# Patient Record
Sex: Female | Born: 1965 | Race: Black or African American | Hispanic: No | Smoking: Never smoker
Health system: Southern US, Community
[De-identification: ages and names within clinical notes are randomized; demographics above are authoritative.]

## PROBLEM LIST (undated history)

## (undated) DIAGNOSIS — J45909 Unspecified asthma, uncomplicated: Secondary | ICD-10-CM

## (undated) DIAGNOSIS — R0600 Dyspnea, unspecified: Secondary | ICD-10-CM

## (undated) DIAGNOSIS — R51 Headache: Secondary | ICD-10-CM

## (undated) DIAGNOSIS — J302 Other seasonal allergic rhinitis: Secondary | ICD-10-CM

## (undated) DIAGNOSIS — K219 Gastro-esophageal reflux disease without esophagitis: Secondary | ICD-10-CM

## (undated) DIAGNOSIS — G4730 Sleep apnea, unspecified: Secondary | ICD-10-CM

## (undated) DIAGNOSIS — R519 Headache, unspecified: Secondary | ICD-10-CM

## (undated) HISTORY — DX: Unspecified asthma, uncomplicated: J45.909

## (undated) HISTORY — DX: Headache: R51

## (undated) HISTORY — DX: Other seasonal allergic rhinitis: J30.2

## (undated) HISTORY — DX: Sleep apnea, unspecified: G47.30

## (undated) HISTORY — DX: Headache, unspecified: R51.9

---
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
## 1992-02-26 HISTORY — PX: TUBAL LIGATION: SHX77

## 2006-12-12 LAB — HM PAP SMEAR: HM Pap smear: NEGATIVE

## 2015-12-23 ENCOUNTER — Encounter: Payer: Self-pay | Admitting: Emergency Medicine

## 2015-12-23 ENCOUNTER — Emergency Department
Admission: EM | Admit: 2015-12-23 | Discharge: 2015-12-23 | Disposition: A | Discharge status: Home / Self Care | Payer: BC Managed Care – PPO | Attending: Emergency Medicine | Admitting: Emergency Medicine

## 2015-12-23 ENCOUNTER — Emergency Department: Payer: BC Managed Care – PPO

## 2015-12-23 DIAGNOSIS — K8050 Calculus of bile duct without cholangitis or cholecystitis without obstruction: Secondary | ICD-10-CM | POA: Diagnosis not present

## 2015-12-23 DIAGNOSIS — R1013 Epigastric pain: Secondary | ICD-10-CM | POA: Diagnosis present

## 2015-12-23 DIAGNOSIS — R079 Chest pain, unspecified: Secondary | ICD-10-CM

## 2015-12-23 LAB — HEPATIC FUNCTION PANEL
ALBUMIN: 2.9 g/dL — AB (ref 3.5–5.0)
ALT: 13 U/L — AB (ref 14–54)
AST: 17 U/L (ref 15–41)
Alkaline Phosphatase: 84 U/L (ref 38–126)
Bilirubin, Direct: <0.1 mg/dL — ABNORMAL LOW (ref 0.1–0.5)
TOTAL PROTEIN: 7.6 g/dL (ref 6.5–8.1)
Total Bilirubin: 0.1 mg/dL — ABNORMAL LOW (ref 0.3–1.2)

## 2015-12-23 LAB — CBC
HEMATOCRIT: 35.8 % (ref 35.0–47.0)
HEMOGLOBIN: 11.9 g/dL — AB (ref 12.0–16.0)
MCH: 28.5 pg (ref 26.0–34.0)
MCHC: 33.1 g/dL (ref 32.0–36.0)
MCV: 86.1 fL (ref 80.0–100.0)
Platelets: 305 10*3/uL (ref 150–440)
RBC: 4.16 MIL/uL (ref 3.80–5.20)
RDW: 13.3 % (ref 11.5–14.5)
WBC: 8.7 10*3/uL (ref 3.6–11.0)

## 2015-12-23 LAB — TROPONIN I: TROPONIN I: 0.02 ng/mL (ref <0.03)

## 2015-12-23 LAB — BASIC METABOLIC PANEL
ANION GAP: 7 (ref 5–15)
BUN: 14 mg/dL (ref 6–20)
CALCIUM: 9.3 mg/dL (ref 8.9–10.3)
CO2: 28 mmol/L (ref 22–32)
Chloride: 105 mmol/L (ref 101–111)
Creatinine, Ser: 1.23 mg/dL — ABNORMAL HIGH (ref 0.44–1.00)
GFR calc Af Amer: 57 mL/min — ABNORMAL LOW (ref >60)
GFR, EST NON AFRICAN AMERICAN: 50 mL/min — AB (ref >60)
GLUCOSE: 159 mg/dL — AB (ref 65–99)
POTASSIUM: 3.7 mmol/L (ref 3.5–5.1)
SODIUM: 140 mmol/L (ref 135–145)

## 2015-12-23 LAB — LIPASE, BLOOD: LIPASE: 16 U/L (ref 11–51)

## 2015-12-23 MED ORDER — ONDANSETRON HCL 4 MG/2ML IJ SOLN
4.0000 mg | Freq: Once | INTRAMUSCULAR | Status: AC
  Administered 2015-12-23: 4 mg via INTRAVENOUS
  Filled 2015-12-23: qty 2

## 2015-12-23 MED ORDER — GI COCKTAIL ~~LOC~~
30.0000 mL | Freq: Once | ORAL | Status: AC
  Administered 2015-12-23: 30 mL via ORAL
  Filled 2015-12-23: qty 30

## 2015-12-23 MED ORDER — MORPHINE SULFATE (PF) 2 MG/ML IV SOLN
4.0000 mg | Freq: Once | INTRAVENOUS | Status: AC
  Administered 2015-12-23: 4 mg via INTRAVENOUS
  Filled 2015-12-23: qty 2

## 2015-12-23 MED ORDER — PANTOPRAZOLE SODIUM 40 MG PO TBEC: 40.0000 mg | DELAYED_RELEASE_TABLET | Freq: Every day | ORAL | 1 refills | Status: DC

## 2015-12-23 MED ORDER — HYDROCODONE-ACETAMINOPHEN 5-325 MG PO TABS: 1.0000 | ORAL_TABLET | ORAL | 0 refills | Status: DC | PRN

## 2015-12-23 NOTE — Discharge Instructions (Signed)
Please call the number provided for general surgery to arrange a follow-up appointment as soon as possible. Please take your pain medication as needed, as prescribed and your Protonix every day. Return to the emergency department for any further abdominal pain, any chest pain, or any other symptom personally concerning to yourself.

## 2015-12-23 NOTE — ED Provider Notes (Signed)
[facility] Emergency Department Provider Note  Time seen: 3:28 PM  I have reviewed the triage vital signs and the nursing notes.   HISTORY  Chief Complaint Chest Pain    HPI Ashley Terry is a 50 y.o. female who presents to the emergency department for upper abdominal pain. According to the patient for the past 5 days she has been experiencing intermittent epigastric/chest pain. Describes the pain as a heaviness in the center of her chest with a burning feeling in the epigastrium. Patient also states nausea with occasional vomiting. States the discomfort was worse today so she came to the emergency department for evaluation. Denies any trouble breathing. Denies any diaphoresis. Denies diarrhea. Denies fever.  History reviewed. No pertinent past medical history.  There are no active problems to display for this patient.   History reviewed. No pertinent surgical history.  Prior to Admission medications   Not on File    Allergies no known allergies  History reviewed. No pertinent family history.  Social History Social History  Substance Use Topics  . Smoking status: Never Smoker  . Smokeless tobacco: Never Used  . Alcohol use No    Review of Systems Constitutional: Negative for fever. Cardiovascular: Positive for chest heaviness/burning  Respiratory: Negative for shortness of breath. Gastrointestinal: Upper abdominal pain positive for nausea or vomiting. Negative diarrhea. Genitourinary: Negative for dysuria. Musculoskeletal: Negative for back pain. Skin: Negative for rash. Neurological: Negative for headache 10-point ROS otherwise negative.  ____________________________________________   PHYSICAL EXAM:  VITAL SIGNS: ED Triage Vitals  Enc Vitals Group     BP 12/23/15 1140 (!) 222/83     Pulse Rate 12/23/15 1140 72     Resp 12/23/15 1140 18     Temp 12/23/15 1140 98.2 F (36.8 C)     Temp Source 12/23/15 1140 Oral     SpO2 12/23/15  1140 100 %     Weight 12/23/15 1141 250 lb (113.4 kg)     Height 12/23/15 1141 5' (1.524 m)     Head Circumference --      Peak Flow --      Pain Score 12/23/15 1141 8     Pain Loc --      Pain Edu? --      Excl. in GC? --     Constitutional: Alert and oriented. Well appearing and in no distress. Eyes: Normal exam ENT   Head: Normocephalic and atraumatic.   Mouth/Throat: Mucous membranes are moist. Cardiovascular: Normal rate, regular rhythm. No murmur Respiratory: Normal respiratory effort without tachypnea nor retractions. Breath sounds are clear Gastrointestinal: Soft, moderate epigastric and right upper quadrant tenderness palpation. No rebound or guarding. No distention. Obese. Musculoskeletal: Nontender with normal range of motion in all extremities.  Neurologic:  Normal speech and language. No gross focal neurologic deficits Skin:  Skin is warm, dry and intact.  Psychiatric: Mood and affect are normal.  ____________________________________________    EKG  EKG reviewed and interpreted, so shows normal sinus rhythm at 66 bpm, narrow QRS, normal axis, normal intervals, no concerning ST changes. Overall reassuring EKG.  ____________________________________________    RADIOLOGY  Chest x-ray negative  ____________________________________________   INITIAL IMPRESSION / ASSESSMENT AND PLAN / ED COURSE  Pertinent labs & imaging results that were available during my care of the patient were reviewed by me and considered in my medical decision making (see chart for details).  The patient presents to the emergency department with chest pain/burning in the epigastric burning. Patient  also states nausea and vomiting. Symptoms have been intermittent over the past 5 days but fairly constant today per patient. Patient has moderate epigastric and right upper quadrant tenderness palpation. Still has her gallbladder. We will check labs including hepatic function panel, lipase,  troponin. We'll obtain a right upper quadrant ultrasound to further evaluate. We'll treat the patient's pain and nausea, IV hydrate while awaiting results.  Patient remains quite hypertensive. We will control the patient's pain and monitor her blood pressure. She states she has never been on blood pressure medications in the past. Last saw her PCP 2 years ago. Sound consistent with cholelithiasis without signs of cholecystitis. I suspect the patient's discomfort is likely due to biliary colic. LFTs are largely within normal limits.  Patient states her pain is nearly gone, but continues to have mild epigastric and right upper quadrant discomfort. I highly suspect biliary colic because of the patient's discomfort. We will discharge with a course of pain medication, a PPI and general surgery follow-up for further evaluation. I discussed return precautions for any further abdominal pain, vomiting or fever, or any chest pain. I also discussed a low-fat diet. Patient agreeable to plan.  ____________________________________________   FINAL CLINICAL IMPRESSION(S) / ED DIAGNOSES  Epigastric pain Chest pain Emesis Biliary colic   Minna AntisKevin Javi Bollman, MD 12/23/15 1924

## 2015-12-23 NOTE — ED Triage Notes (Signed)
Pt to ed with c/o chest tightness intermittent x 4 days.  Pt denies sob, reports headache, nausea, and bilat arm weakness.

## 2015-12-23 NOTE — ED Notes (Signed)
Pt back from us. States no dx of htn but hasn't seen pcp in "years" - currently has no pcp.

## 2015-12-25 ENCOUNTER — Telehealth: Payer: Self-pay | Admitting: Surgery

## 2015-12-25 NOTE — Telephone Encounter (Signed)
Left voice message for patient to call and schedule appointment

## 2015-12-27 ENCOUNTER — Other Ambulatory Visit: Payer: Self-pay

## 2016-01-03 ENCOUNTER — Telehealth: Payer: Self-pay

## 2016-01-03 ENCOUNTER — Ambulatory Visit (INDEPENDENT_AMBULATORY_CARE_PROVIDER_SITE_OTHER): Payer: BC Managed Care – PPO | Admitting: Surgery

## 2016-01-03 ENCOUNTER — Encounter: Payer: Self-pay | Admitting: Surgery

## 2016-01-03 VITALS — BP 203/93 | HR 70 | Temp 97.9°F | Ht 60.0 in | Wt 298.5 lb

## 2016-01-03 DIAGNOSIS — R079 Chest pain, unspecified: Secondary | ICD-10-CM

## 2016-01-03 DIAGNOSIS — I10 Essential (primary) hypertension: Secondary | ICD-10-CM | POA: Diagnosis not present

## 2016-01-03 DIAGNOSIS — K8020 Calculus of gallbladder without cholecystitis without obstruction: Secondary | ICD-10-CM | POA: Diagnosis not present

## 2016-01-03 MED ORDER — FAMOTIDINE 20 MG PO TABS: 20.0000 mg | ORAL_TABLET | Freq: Two times a day (BID) | ORAL | 1 refills | Status: DC

## 2016-01-03 NOTE — Progress Notes (Signed)
Ashley Terry is an 50 y.o. female.   Chief Complaint: Gallstones HPI: This a patient who is in the emergency room recently with upper abdominal pain and was ruled out for chest source. A workup continued to show signs of upper abdominal pain and tenderness with cholelithiasis on ultrasound without signs of cholecystitis and normal liver function tests. Patient describes 1 month of upper abdominal and chest pain. Today she has no abdominal pain but points to her upper chest near the upper sternum as a source of her pain. Yesterday she was short of breath as well. She's had some nausea but no vomiting in the last 10 days she has vomited previous today. Denies fevers or chills no jaundice or acholic stools  Family history is noncontributory Patient works at [employer] does not smoke or drink    History reviewed. No pertinent past medical history.  Past Surgical History:  Procedure Laterality Date  . CESAREAN SECTION  1980  . CESAREAN SECTION  1992  . TONSILECTOMY, ADENOIDECTOMY, BILATERAL MYRINGOTOMY AND TUBES  1980  . TUBAL LIGATION  1994    Family History  Problem Relation Age of Onset  . Diabetes Mother   . Hypertension Mother   . Stroke Father   . Pancreatic cancer Maternal Grandmother   . Breast cancer Paternal Grandmother    Social History:  reports that she has never smoked. She has never used smokeless tobacco. She reports that she does not drink alcohol or use drugs.  Allergies: No Known Allergies   (Not in a hospital admission)   Review of Systems:   Review of Systems  Constitutional: Negative for chills and fever.  HENT: Negative.   Eyes: Negative.   Respiratory: Positive for shortness of breath. Negative for wheezing.   Cardiovascular: Positive for chest pain. Negative for palpitations.  Gastrointestinal: Positive for abdominal pain and nausea. Negative for blood in stool, constipation, diarrhea and vomiting.  Genitourinary: Negative.   Musculoskeletal: Negative.    Skin: Negative.   Neurological: Negative.   Endo/Heme/Allergies: Negative.   Psychiatric/Behavioral: Negative.     Physical Exam:  Physical Exam  Constitutional: She is oriented to person, place, and time. No distress.  Morbidly obese Blood pressure 203/93  HENT:  Head: Normocephalic and atraumatic.  Eyes: Pupils are equal, round, and reactive to light. Right eye exhibits no discharge. Left eye exhibits no discharge. No scleral icterus.  Neck: Normal range of motion.  Cardiovascular: Normal rate, regular rhythm and normal heart sounds.   Pulmonary/Chest: Effort normal and breath sounds normal. No respiratory distress. She has no wheezes. She has no rales.  Abdominal: Soft. She exhibits no distension. There is tenderness. There is no rebound and no guarding.  Minimal tenderness across the upper abdomen and epigastrium without a Murphy sign  Musculoskeletal: Normal range of motion. She exhibits edema. She exhibits no tenderness.  Lymphadenopathy:    She has no cervical adenopathy.  Neurological: She is alert and oriented to person, place, and time.  Skin: Skin is warm and dry. No rash noted. She is not diaphoretic. No erythema.  Psychiatric: Mood and affect normal.  Vitals reviewed.   There were no vitals taken for this visit.    No results found for this or any previous visit (from the past 48 hour(s)). No results found.   Assessment/Plan This a patient with chest pain his chief complaint. Currently she has no abdominal pain and states that she was given a prescription for protonic's but stopped taking it because it made her  feel bad. It worsened her chest pain. She has not seen a primary care physician in several years. And takes no other medications.  With the upper chest pain and high blood pressure I would like her to see a primary care physician first I cannot schedule her for any sort of surgery with a systolic blood pressure over 200 and have that be a safe event.  Because of her problems with protonic's I will try an H2 blocker instead and see if that makes any difference in her upper chest symptoms that could be related to reflux or hyperacidity syndrome.  I will see her back shortly and decide about surgical options.  Ashley Hawichard E Elis Sauber, MD, FACS

## 2016-01-03 NOTE — Telephone Encounter (Signed)
LVM at this time for patient to call office regarding appointment with PCP.  Patient has an appointment with Ashley Terry 01/05/16 @ 9:00 am for elevated blood pressure.  Patient notified of follow up appointment with Dr.Cooper on 01/24/16 @ 9:00 am.

## 2016-01-03 NOTE — Patient Instructions (Addendum)
We would like for you to see a Primary Care physician. I will call you later today with an appointment. We would like for you to follow up with Dr.Cooper after you see the PCP. We have sent your medicine to your pharmacy.  Please call our office if you have questions or concerns.

## 2016-01-05 ENCOUNTER — Ambulatory Visit: Payer: BC Managed Care – PPO | Admitting: Family Medicine

## 2016-01-05 ENCOUNTER — Encounter: Payer: Self-pay | Admitting: Family Medicine

## 2016-01-05 ENCOUNTER — Ambulatory Visit (INDEPENDENT_AMBULATORY_CARE_PROVIDER_SITE_OTHER): Payer: BC Managed Care – PPO | Admitting: Family Medicine

## 2016-01-05 VITALS — BP 172/96 | HR 73 | Temp 97.8°F | Resp 16 | Ht 60.0 in | Wt 296.0 lb

## 2016-01-05 DIAGNOSIS — Z7689 Persons encountering health services in other specified circumstances: Secondary | ICD-10-CM

## 2016-01-05 DIAGNOSIS — G8929 Other chronic pain: Secondary | ICD-10-CM | POA: Insufficient documentation

## 2016-01-05 DIAGNOSIS — I10 Essential (primary) hypertension: Secondary | ICD-10-CM | POA: Diagnosis not present

## 2016-01-05 DIAGNOSIS — Z23 Encounter for immunization: Secondary | ICD-10-CM | POA: Diagnosis not present

## 2016-01-05 DIAGNOSIS — R519 Headache, unspecified: Secondary | ICD-10-CM | POA: Insufficient documentation

## 2016-01-05 DIAGNOSIS — Z6841 Body Mass Index (BMI) 40.0 and over, adult: Secondary | ICD-10-CM | POA: Diagnosis not present

## 2016-01-05 DIAGNOSIS — G4733 Obstructive sleep apnea (adult) (pediatric): Secondary | ICD-10-CM | POA: Insufficient documentation

## 2016-01-05 DIAGNOSIS — E118 Type 2 diabetes mellitus with unspecified complications: Secondary | ICD-10-CM | POA: Diagnosis not present

## 2016-01-05 DIAGNOSIS — K219 Gastro-esophageal reflux disease without esophagitis: Secondary | ICD-10-CM

## 2016-01-05 DIAGNOSIS — G44229 Chronic tension-type headache, not intractable: Secondary | ICD-10-CM

## 2016-01-05 DIAGNOSIS — R110 Nausea: Secondary | ICD-10-CM | POA: Diagnosis not present

## 2016-01-05 DIAGNOSIS — R7989 Other specified abnormal findings of blood chemistry: Secondary | ICD-10-CM | POA: Diagnosis not present

## 2016-01-05 DIAGNOSIS — R51 Headache: Secondary | ICD-10-CM

## 2016-01-05 DIAGNOSIS — K8020 Calculus of gallbladder without cholecystitis without obstruction: Secondary | ICD-10-CM | POA: Insufficient documentation

## 2016-01-05 DIAGNOSIS — G4730 Sleep apnea, unspecified: Secondary | ICD-10-CM | POA: Insufficient documentation

## 2016-01-05 DIAGNOSIS — E1140 Type 2 diabetes mellitus with diabetic neuropathy, unspecified: Secondary | ICD-10-CM | POA: Insufficient documentation

## 2016-01-05 DIAGNOSIS — I129 Hypertensive chronic kidney disease with stage 1 through stage 4 chronic kidney disease, or unspecified chronic kidney disease: Secondary | ICD-10-CM | POA: Insufficient documentation

## 2016-01-05 MED ORDER — SUCRALFATE 1 G PO TABS: 1.0000 g | ORAL_TABLET | Freq: Three times a day (TID) | ORAL | 0 refills | Status: DC

## 2016-01-05 MED ORDER — HYDROCHLOROTHIAZIDE 25 MG PO TABS: 25.0000 mg | ORAL_TABLET | Freq: Every day | ORAL | 5 refills | Status: DC

## 2016-01-05 MED ORDER — ONDANSETRON 4 MG PO TBDP: 4.0000 mg | ORAL_TABLET | Freq: Three times a day (TID) | ORAL | 0 refills | Status: DC | PRN

## 2016-01-05 MED ORDER — LISINOPRIL 20 MG PO TABS: 20.0000 mg | ORAL_TABLET | Freq: Every day | ORAL | 5 refills | Status: DC

## 2016-01-05 NOTE — Progress Notes (Signed)
Subjective:    Patient ID: Ashley Terry, female    DOB: 1965-10-07, 50 y.o.   MRN: [id number]  Ashley Buffammy Poteet is a 50 y.o. female presenting on 01/05/2016 for Establish Care  [facility], last visit >5 years ago.  HPI   Hospital Follow-up / Recent Chest Pain Burning / CHOLELITHIASIS / GERD - Reported onset few weeks ago, she initially presented to Urgent Care then advised to go to ED for chest pain evaluation, with abdominal pain had work-up including RUQ Abdominal US showing cholelithiasis without cholecystitis - Saw Surgeons in office, thought symptoms due to gallstones, interested in elective surgery, has follow-up 11/29 - Still has symptoms of RUQ abdominal pain and heartburn with suspected GERD, was not taking protonix regular, tried at night did not tolerate - Has apt in 3 weeks with Surgery and they would discuss proceeding with elective choley if BP controlled  CHRONIC HTN: Reports does not check BP at home. Has never had prior diagnosis, significant elevated BP at general surgery office, referred to PCP now  Current Meds - None   - Drinks one cup coffee daily, no sodas, no tea, drinks water. 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 Still eating regular sweets donuts and chocolates occasionally. Follows DM low carb diet. - Exercise: previously regular walking and strength training, now she is no longer using weights. No longer walking, given recent health scare she is interested in goal of walking 3 days a week.  CHRONIC DM, Type 2: Reports previously her diabetes was well controlled without any medications and never insulin. Does not  know last A1c has been years. Does not check CBG - Was on ASA 81 previously, interested to restart Never on ACEi / ARB previously Denies hypoglycemia, polyuria, visual changes, numbness or tingling.  Chronic Headaches - Reports prior history of migraines only occasional, recently worsening over past 2 years with work and stress, uses computer constantly, headache worse with computer screen use. - She admits to having headaches for several days at a time. Most days, currently slight frontal bilateral temple headache - Admits nausea, occasional vomiting (NB NB) more likely from gallbladder - Takes occasional Ibuprofen 2 pills or Tylenol, doesn't repeat, moderate relief  OSA, on CPAP - Reports chronic problem for many years, first sleep study and CPAP machine 2000, wearing regularly, tolerating well. Does not have concerns. - Denies excessive sleepiness  Health Maintenance: - Due for Flu, received today - Defer other HM topics for next follow-up, due to other acute issues  Past Medical History:  Diagnosis Date  . Asthma   . Diabetes (HCC)   . Frequent headaches   . High blood pressure   . Seasonal allergies   . Sleep apnea    Social History   Social History  . Marital status: Widowed    Spouse name: N/A  . Number of children: N/A  . Years of education: N/A   Occupational History  . 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 Smoking status: Never Smoker  . Smokeless tobacco: Never Used  . Alcohol use No  . Drug use: No  . Sexual activity:  No   Other Topics Concern  . Not on file   Social History Narrative  . No narrative on file   Family History  Problem Relation Age of Onset  . Diabetes Mother   . Hypertension Mother   . Stroke Father   . Cancer Father     Amyloid  . Pancreatic cancer Maternal Grandmother   . Breast cancer Paternal Grandmother   . Prostate cancer Maternal Grandfather   . Cancer Paternal Grandfather     unknown     Current Outpatient Prescriptions on File Prior to Visit  Medication Sig  . famotidine (PEPCID) 20 MG tablet Take 1 tablet (20 mg total) by mouth 2 (two) times daily.   No current facility-administered medications on file prior to visit.     Review of Systems  Constitutional: Negative for activity change, appetite change, chills, diaphoresis, fatigue, fever and unexpected weight change.  HENT: Negative for congestion, hearing loss, sinus pain and sinus pressure.   Eyes: Negative for visual disturbance.  Respiratory: Negative for cough, choking, chest tightness, shortness of breath and wheezing.   Cardiovascular: Negative for chest pain, palpitations and leg swelling.  Gastrointestinal: Positive for abdominal distention, abdominal pain and nausea. Negative for anal bleeding, blood in stool, constipation, diarrhea and vomiting.  Endocrine: Negative for cold intolerance and polyuria.  Genitourinary: Negative for dysuria, frequency and hematuria.  Musculoskeletal: Negative for arthralgias, back pain and neck pain.  Skin: Negative for rash.  Allergic/Immunologic: Negative for environmental allergies.  Neurological: Positive for headaches. Negative for dizziness, weakness, light-headedness and numbness.  Hematological: Negative for adenopathy.  Psychiatric/Behavioral: Negative for behavioral problems, dysphoric mood and sleep disturbance. The patient is not nervous/anxious.    Per HPI unless specifically indicated above     Objective:    BP (!) 172/96 (BP Location: Left Arm, Cuff Size: Large)   Pulse 73   Temp 97.8 F (36.6 C) (Oral)   Resp 16   Ht 5' (1.524 m)   Wt 296 lb (134.3 kg)   BMI 57.81 kg/m   Wt Readings from Last 3 Encounters:  01/05/16 296 lb (134.3 kg)  01/03/16 298 lb 8 oz (135.4 kg)  12/23/15 250 lb (113.4 kg)    Physical Exam  Constitutional: She is oriented to person, place, and time. She appears well-developed and well-nourished. No distress.   Well-appearing, comfortable, cooperative, obese  HENT:  Head: Normocephalic and atraumatic.  Mouth/Throat: Oropharynx is clear and moist.  Frontal / maxillary sinuses non-tender. Nares patent without purulence or edema. Oropharynx clear without erythema, exudates, edema or asymmetry.  Eyes: Conjunctivae and EOM are normal. Pupils are equal, round, and reactive to light.  Neck: Normal range of motion. Neck supple. No thyromegaly present.  Cardiovascular: Normal rate, regular rhythm, normal heart sounds and intact distal pulses.   No murmur heard. Pulmonary/Chest: Effort normal and breath sounds normal. No respiratory distress. She has no wheezes. She has no rales.  Abdominal: Soft. Bowel sounds are normal. She exhibits no distension and no mass. There is no tenderness.  Musculoskeletal: Normal range of motion. She exhibits edema (bilateral lower extremity +1 pitting edema ankles and lower leg with trace). She exhibits no tenderness.  Back normal without deformity or abnormal curvature, limited exam by body habitus.  Upper / Lower Extremities: - Normal muscle tone, strength bilateral upper extremities 5/5, lower extremities 5/5  - Bilateral shoulders, knees without deformity, tenderness, effusion  - Normal Gait  Lymphadenopathy:    She has no cervical adenopathy.  Neurological:  She is alert and oriented to person, place, and time.  Skin: Skin is warm and dry. No rash noted. She is not diaphoretic.  Psychiatric: She has a normal mood and affect. Her behavior is normal.  Nursing note and vitals reviewed.  I have personally reviewed the radiology report from [facility] [place] on 12/23/15.  EXAM: US ABDOMEN LIMITED - RIGHT UPPER QUADRANT COMPARISON:  None. FINDINGS: Gallbladder: Cholelithiasis is noted without gallbladder wall thickening or pericholecystic fluid. The largest measures 9 mm. No sonographic Murphy's sign is noted. Common bile duct: Diameter: 4 mm which is within normal  limits. Liver: No focal lesion identified. Within normal limits in parenchymal echogenicity. IMPRESSION: Cholelithiasis without evidence cholecystitis. No other abnormality seen in the right upper quadrant of the abdomen.  I have personally reviewed the following lab results from 12/23/15.  Results for orders placed or performed during the hospital encounter of 12/23/15  Basic metabolic panel  Result Value Ref Range   Sodium 140 135 - 145 mmol/L   Potassium 3.7 3.5 - 5.1 mmol/L   Chloride 105 101 - 111 mmol/L   CO2 28 22 - 32 mmol/L   Glucose, Bld 159 (H) 65 - 99 mg/dL   BUN 14 6 - 20 mg/dL   Creatinine, Ser 4.09 (H) 0.44 - 1.00 mg/dL   Calcium 9.3 8.9 - 81.1 mg/dL   GFR calc non Af Amer 50 (L) >60 mL/min   GFR calc Af Amer 57 (L) >60 mL/min   Anion gap 7 5 - 15  CBC  Result Value Ref Range   WBC 8.7 3.6 - 11.0 K/uL   RBC 4.16 3.80 - 5.20 MIL/uL   Hemoglobin 11.9 (L) 12.0 - 16.0 g/dL   HCT 91.4 78.2 - 95.6 %   MCV 86.1 80.0 - 100.0 fL   MCH 28.5 26.0 - 34.0 pg   MCHC 33.1 32.0 - 36.0 g/dL   RDW 21.3 08.6 - 57.8 %   Platelets 305 150 - 440 K/uL  Troponin I  Result Value Ref Range   Troponin I 0.02 <0.03 ng/mL  Hepatic function panel  Result Value Ref Range   Total Protein 7.6 6.5 - 8.1 g/dL   Albumin 2.9 (L) 3.5 - 5.0 g/dL   AST 17 15 - 41 U/L   ALT 13 (L) 14 - 54 U/L   Alkaline Phosphatase 84 38 - 126 U/L   Total Bilirubin 0.1 (L) 0.3 - 1.2 mg/dL   Bilirubin, Direct <4.6 (L) 0.1 - 0.5 mg/dL   Indirect Bilirubin NOT CALCULATED 0.3 - 0.9 mg/dL  Lipase, blood  Result Value Ref Range   Lipase 16 11 - 51 U/L      Assessment & Plan:   Problem List Items Addressed This Visit    Sleep apnea    Continue CPAP, seems to be controlled, unlikely affecting BP but possible.      Morbid obesity with BMI of 50.0-59.9, adult (HCC)    Weight gain, no longer adhering to lifestyle improvements over past few years, inc stress - Check fasting labs - Follow-up, encouraged  lifestyle mods, with goal exercise walking 3x weekly for now      Relevant Orders   Lipid panel   TSH   Gastroesophageal reflux disease without esophagitis    Poorly controlled. Emphasized dietary modifications - Switch Protonix to morning 30 min before eating dosing - Add Carafate TID wc + bedtime for symptomatic relief - Follow-up as needed      Relevant Medications  pantoprazole (PROTONIX) 40 MG tablet   sucralfate (CARAFATE) 1 g tablet   ondansetron (ZOFRAN ODT) 4 MG disintegrating tablet   Essential hypertension    Uncontrolled HTN, likely for many years, previously >180-200 / 100. Initial check in office 170/90, similar on manual re-check. - No prior dx HTN. No prior meds  Plan: 1. Start Lisinopril 20mg  daily, HCTZ 25mg  daily 2. Check future BMET 10 days after new ACE start 3. Monitor BP outside office if can 4. Start ASA 81mg  daily 5. Lifestyle modifications start exercise and wt loss 6. Follow-up 2 weeks BP re-check, prior to upcoming Gen Surg f/u to discuss elective choley if BP controlled      Relevant Medications   hydrochlorothiazide (HYDRODIURIL) 25 MG tablet   lisinopril (PRINIVIL,ZESTRIL) 20 MG tablet   Other Relevant Orders   BASIC METABOLIC PANEL WITH GFR   TSH   Elevated serum creatinine    Last Cr 1.23 in ED, suspected mild dehydration. Not sure baseline Cr. Concern possible CKD with uncontrolled HTN and DM. - Re-check Cr 10 days (after new start ACE, HCTZ)      Diabetes (HCC)    Unknown level of control. Previously diagnosed >5 years ago diet controlled only, has had dramatic wt change over that time. Recent non fasting glucose 159 in ED - Elevated Cr to 1.23, possibly dehydration acutely in ED vs some complication with CKD  Plan: 1. Start ACE, check BMET 2. Check lipids, A1c for baseline and determine management 3. Lifestyle modifications 4. Restart ASA 81 daily 5. Follow-up 2 weeks review results, may be indicated for Metformin, statin or other  DM treatments pending A1c      Relevant Medications   lisinopril (PRINIVIL,ZESTRIL) 20 MG tablet   Other Relevant Orders   BASIC METABOLIC PANEL WITH GFR   Hemoglobin A1c   Lipid panel   Chronic headache    Likely secondary to tension headaches from stress, screen use and likely with uncontrolled HTN for years - Control BP, address cholelithiasis, control DM - Improve lifestyle - Re-address headaches if persistent      Relevant Medications   HYDROcodone-acetaminophen (NORCO/VICODIN) 5-325 MG tablet   Cholelithiasis    Persistently symptomatic, without evidence of cholecystitis. Close follow-up with general surgery pursue discussion of elective choley if we can improve BP control       Other Visit Diagnoses    Encounter to establish care with new doctor    -  Primary   Need for vaccination       Relevant Orders   Flu Vaccine QUAD 36+ mos PF IM (Fluarix & Fluzone Quad PF) (Completed)   Nausea without vomiting       Relevant Medications   ondansetron (ZOFRAN ODT) 4 MG disintegrating tablet      Meds ordered this encounter  Medications  .           .           . hydrochlorothiazide (HYDRODIURIL) 25 MG tablet    Sig: Take 1 tablet (25 mg total) by mouth daily.    Dispense:  30 tablet    Refill:  5  . lisinopril (PRINIVIL,ZESTRIL) 20 MG tablet    Sig: Take 1 tablet (20 mg total) by mouth daily.    Dispense:  30 tablet    Refill:  5  . DISCONTD: sucralfate (CARAFATE) 1 g tablet    Sig: Take 1 tablet (1 g total) by mouth 4 (four) times daily -  with meals and  at bedtime.    Dispense:  60 tablet    Refill:  0  . sucralfate (CARAFATE) 1 g tablet    Sig: Take 1 tablet (1 g total) by mouth 4 (four) times daily -  with meals and at bedtime.    Dispense:  60 tablet    Refill:  0  . ondansetron (ZOFRAN ODT) 4 MG disintegrating tablet    Sig: Take 1 tablet (4 mg total) by mouth every 8 (eight) hours as needed for nausea or vomiting.    Dispense:  30 tablet    Refill:  0       Follow up plan: Return in about 2 weeks (around 01/19/2016) for blood pressure, diabetes.  Saralyn Pilar, DO [facility] 01/06/2016, 11:42 AM

## 2016-01-05 NOTE — Patient Instructions (Signed)
Thank you for coming in to clinic today.  1. Start 2 new BP pills every day same time 2. Start regular exercise with walking as discussed 3. Try to check BP outside of office a few times, write down readings  You will be due for FASTING BLOOD WORK (no food or drink after midnight before, only water or coffee without cream/sugar on the morning of)  - Please go ahead and schedule a "Lab Only" visit in the morning at the clinic for lab draw in 10 days  For Lab Results, once available within 2-3 days of blood draw, you can can log in to MyChart online to view your results and a brief explanation. Also, we can discuss results at next follow-up visit.  Take baby ASA 81 daily  Recommend to start taking Tylenol Extra Strength 500mg  tabs - take 1 to 2 tabs (max 1000mg  per dose) every 6 hours for pain (take regularly, don't skip a dose for next 3-7 days), max 24 hour daily dose is 6 to 8 tablets or 3000 to 4000mg   Please schedule a follow-up appointment with Dr. Althea CharonKaramalegos in 2 weeks for BP, lab follow-up, diabetes  If you have any other questions or concerns, please feel free to call the clinic or send a message through MyChart. You may also schedule an earlier appointment if necessary.  [name] Karamalegos, DO [facility]

## 2016-01-05 NOTE — Telephone Encounter (Signed)
[place] [facility] at this time to check if patient showed for her appointment today for elevated blood pressure and was told yes by Liborio NixonJanice.

## 2016-01-06 ENCOUNTER — Encounter: Payer: Self-pay | Admitting: Family Medicine

## 2016-01-06 DIAGNOSIS — N183 Chronic kidney disease, stage 3 unspecified: Secondary | ICD-10-CM | POA: Insufficient documentation

## 2016-01-06 NOTE — Assessment & Plan Note (Signed)
Last Cr 1.23 in ED, suspected mild dehydration. Not sure baseline Cr. Concern possible CKD with uncontrolled HTN and DM. - Re-check Cr 10 days (after new start ACE, HCTZ)

## 2016-01-06 NOTE — Assessment & Plan Note (Signed)
Weight gain, no longer adhering to lifestyle improvements over past few years, inc stress - Check fasting labs - Follow-up, encouraged lifestyle mods, with goal exercise walking 3x weekly for now

## 2016-01-06 NOTE — Assessment & Plan Note (Addendum)
Unknown level of control. Previously diagnosed >5 years ago diet controlled only, has had dramatic wt change over that time. Recent non fasting glucose 159 in ED - Elevated Cr to 1.23, possibly dehydration acutely in ED vs some complication with CKD  Plan: 1. Start ACE, check BMET 2. Check lipids, A1c for baseline and determine management 3. Lifestyle modifications 4. Restart ASA 81 daily 5. Follow-up 2 weeks review results, may be indicated for Metformin, statin or other DM treatments pending A1c

## 2016-01-06 NOTE — Assessment & Plan Note (Signed)
Continue CPAP, seems to be controlled, unlikely affecting BP but possible.

## 2016-01-06 NOTE — Assessment & Plan Note (Signed)
Poorly controlled. Emphasized dietary modifications - Switch Protonix to morning 30 min before eating dosing - Add Carafate TID wc + bedtime for symptomatic relief - Follow-up as needed

## 2016-01-06 NOTE — Assessment & Plan Note (Signed)
Likely secondary to tension headaches from stress, screen use and likely with uncontrolled HTN for years - Control BP, address cholelithiasis, control DM - Improve lifestyle - Re-address headaches if persistent

## 2016-01-06 NOTE — Assessment & Plan Note (Signed)
Persistently symptomatic, without evidence of cholecystitis. Close follow-up with general surgery pursue discussion of elective choley if we can improve BP control

## 2016-01-06 NOTE — Assessment & Plan Note (Signed)
Uncontrolled HTN, likely for many years, previously >180-200 / 100. Initial check in office 170/90, similar on manual re-check. - No prior dx HTN. No prior meds  Plan: 1. Start Lisinopril 20mg  daily, HCTZ 25mg  daily 2. Check future BMET 10 days after new ACE start 3. Monitor BP outside office if can 4. Start ASA 81mg  daily 5. Lifestyle modifications start exercise and wt loss 6. Follow-up 2 weeks BP re-check, prior to upcoming Gen Surg f/u to discuss elective choley if BP controlled

## 2016-01-15 ENCOUNTER — Telehealth: Payer: Self-pay

## 2016-01-15 NOTE — Telephone Encounter (Signed)
Called patient because I saw that she was coming in to see Dr. Excell Seltzerooper on 01/24/2016. However, she needs her blood pressure to be under control before she comes back and discuss surgery with Dr. Excell Seltzerooper. According to her primary care doctor, she was to go back in two weeks from 01/05/2016 and saw thaw that she did not schedule a follow up appointment with him. I left patient a detailed message letting her know that she needed to see her primary care doctor prior to seeing Dr. Excell Seltzerooper. I advised for her to call her primary care doctor and schedule an appointment before 01/24/2016. If not, then we would have to reschedule her appointment until we get an okay (Medical Clearance) from her primary care doctor that she has her blood pressure under control.

## 2016-01-22 ENCOUNTER — Other Ambulatory Visit: Payer: Self-pay | Admitting: Family Medicine

## 2016-01-22 ENCOUNTER — Ambulatory Visit (INDEPENDENT_AMBULATORY_CARE_PROVIDER_SITE_OTHER): Payer: BC Managed Care – PPO | Admitting: Family Medicine

## 2016-01-22 ENCOUNTER — Encounter: Payer: Self-pay | Admitting: Family Medicine

## 2016-01-22 ENCOUNTER — Telehealth: Payer: Self-pay

## 2016-01-22 VITALS — BP 168/82 | HR 75 | Temp 98.2°F | Resp 16 | Ht 60.0 in | Wt 293.0 lb

## 2016-01-22 DIAGNOSIS — K8020 Calculus of gallbladder without cholecystitis without obstruction: Secondary | ICD-10-CM | POA: Diagnosis not present

## 2016-01-22 DIAGNOSIS — Z6841 Body Mass Index (BMI) 40.0 and over, adult: Secondary | ICD-10-CM | POA: Diagnosis not present

## 2016-01-22 DIAGNOSIS — K219 Gastro-esophageal reflux disease without esophagitis: Secondary | ICD-10-CM | POA: Diagnosis not present

## 2016-01-22 DIAGNOSIS — I10 Essential (primary) hypertension: Secondary | ICD-10-CM

## 2016-01-22 DIAGNOSIS — G44229 Chronic tension-type headache, not intractable: Secondary | ICD-10-CM | POA: Diagnosis not present

## 2016-01-22 DIAGNOSIS — R7989 Other specified abnormal findings of blood chemistry: Secondary | ICD-10-CM | POA: Diagnosis not present

## 2016-01-22 DIAGNOSIS — R0789 Other chest pain: Secondary | ICD-10-CM | POA: Diagnosis not present

## 2016-01-22 LAB — BASIC METABOLIC PANEL WITH GFR
BUN: 20 mg/dL (ref 7–25)
CO2: 29 mmol/L (ref 20–31)
Calcium: 9.7 mg/dL (ref 8.6–10.4)
Chloride: 103 mmol/L (ref 98–110)
Creat: 1.41 mg/dL — ABNORMAL HIGH (ref 0.50–1.05)
GFR, EST AFRICAN AMERICAN: 49 mL/min — AB (ref >=60)
GFR, EST NON AFRICAN AMERICAN: 43 mL/min — AB (ref >=60)
Glucose, Bld: 149 mg/dL — ABNORMAL HIGH (ref 65–99)
POTASSIUM: 4.2 mmol/L (ref 3.5–5.3)
Sodium: 139 mmol/L (ref 135–146)

## 2016-01-22 LAB — HEMOGLOBIN A1C
Hgb A1c MFr Bld: 8.4 % — ABNORMAL HIGH (ref <5.7)
Mean Plasma Glucose: 194 mg/dL

## 2016-01-22 LAB — LIPID PANEL
CHOL/HDL RATIO: 7.3 ratio — AB (ref <5.0)
CHOLESTEROL: 270 mg/dL — AB (ref <200)
HDL: 37 mg/dL — AB (ref >50)
LDL Cholesterol: 181 mg/dL — ABNORMAL HIGH (ref <100)
TRIGLYCERIDES: 260 mg/dL — AB (ref <150)
VLDL: 52 mg/dL — ABNORMAL HIGH (ref <30)

## 2016-01-22 MED ORDER — AMLODIPINE BESYLATE 10 MG PO TABS: 10.0000 mg | ORAL_TABLET | Freq: Every day | ORAL | 5 refills | Status: DC

## 2016-01-22 MED ORDER — PANTOPRAZOLE SODIUM 40 MG PO TBEC: 40.0000 mg | DELAYED_RELEASE_TABLET | Freq: Every day | ORAL | 2 refills | Status: DC

## 2016-01-22 NOTE — Assessment & Plan Note (Signed)
Likely tension headaches, still HTN uncontrolled - Continue to address underlying factors, no changes to headache treatment today

## 2016-01-22 NOTE — Assessment & Plan Note (Signed)
Now mild improved with some wt loss on improved lifestyle some regular walking now at goal 3x weekly - Check fasting labs - Follow-up, encouraged lifestyle mods

## 2016-01-22 NOTE — Assessment & Plan Note (Addendum)
Persistent symptoms without worsening or significant improvement over 2 weeks. No evidence of cholecystitis. - Concern that current symptoms do not seem all entirely related to cholecystitis, concern with uncontrolled HTN among other issues that increase her risk for possible cardiovascular etiology - [facility] checked notice that I am not clearing her for cardiac surgery, referral to Cardiology placed, to be faxed back

## 2016-01-22 NOTE — Progress Notes (Signed)
Subjective:    Patient ID: Ashley Terry, female    DOB: [birth year], 50 y.o.   MRN: [id number]  Ashley Terry is a 50 y.o. female presenting on 01/22/2016 for Hypertension (pt is seeing Dr. Excell Seltzerooper for gallbladder surgery on 11/29 need medical clearance for B/P)  HPI   Chest Pressure / CHOLELITHIASIS / GERD - Follow-up from last visit with me to establish care on 11/10 after dx uncontrolled HTN pending future elective cholecystectomy. See last note for background history. - Today reports essentially unchanged constant chest substernal chest heaviness, with sharp pain to back on deep breaths, describes as a chest congestion or tightness with some burning, and pain is reproducible to touch. Worse with laying down with increased burning. Some worsening with eating, but she tolerates soups well. - Taking carafate QID without any significant relief - Taking Protonix 40mg  daily, requests refill today - Still nausea, takes Zofran ODT regularly with some minor relief - She is scheduled to see Dr Excell Seltzerooper (General Surgery) in follow-up on 11/29 for pre-op evaluation for elective choley given recent dx cholelithiasis on Abdominal US in ED recently, however they are waiting on cardiac / medical clearance prior to proceeding. Patient has not seen Cardiologist before, has not had prior stress test or ECHO. Last EKG done in ED on 12/23/15. She can tolerate increased activity with walking exercise, but still having same symptoms does not seem to be related to physical exertion  CHRONIC HTN: - Last visit 11/10 with uncontrolled HTN, suspected for several years, see note for background info - Today she reports taking the Lisinopril 20 and HCTZ 25mg  daily as prescribed >2 weeks ago, home BP checks have slowly improved but still elevated, initially SBP 180-200, now avg 160s-170 with DBP 70-80s - Never taken any other anti-HTN meds before - Limited caffeine, never smoker, no alcohol. 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[employer])  MORBID OBESITY BMI >57 - Weight previously 350 lbs down to 290 lbs over past few years, attributes to stress at work and no longer exercising or keeping up with her health - Today wt still down a few lbs, 293 from 296+ previously, trying to adhere to healthy diet and now starting to do more walking up to 3 days a week as set for goal last time  Chronic Headaches - Still regular headaches, onset in morning and will wake up with headache at night, bilateral frontal tension headache, takes Tylenol Ext Str 1000mg  per dose TID and occasional excedrin migraine with relief (has only taken x 2 doses). Prior history of migraine headaches worse with computer screen use. - She admits to having headaches for several days at a time. Most days, currently slight frontal bilateral temple headache - Admits nausea without vomiting, see above  Review of Systems  Constitutional: Negative for activity change, appetite change, chills, diaphoresis, fatigue, fever and unexpected weight change.  HENT: Negative for congestion, hearing loss, sinus pain and sinus pressure.   Eyes: Negative for visual disturbance.  Respiratory: Negative for cough, choking, chest tightness, shortness of breath and wheezing.   Cardiovascular: Negative for chest pain, palpitations and leg swelling.  Gastrointestinal: Positive for abdominal distention, abdominal pain and nausea. Negative for anal bleeding, blood in stool, constipation, diarrhea and vomiting.  Endocrine: Negative for cold intolerance and polyuria.  Genitourinary: Negative for dysuria, frequency and hematuria.  Musculoskeletal: Negative for arthralgias, back pain and neck pain.  Skin: Negative for rash.  Allergic/Immunologic: Negative for environmental  allergies.  Neurological: Positive for headaches. Negative for dizziness, weakness, light-headedness and numbness.  Hematological: Negative for adenopathy.    Psychiatric/Behavioral: Negative for behavioral problems, dysphoric mood and sleep disturbance. The patient is not nervous/anxious.    Per HPI unless specifically indicated above     Objective:    BP (!) 168/82 (BP Location: Left Arm, Cuff Size: Normal)   Pulse 75   Temp 98.2 F (36.8 C) (Oral)   Resp 16   Ht 5' (1.524 m)   Wt 293 lb (132.9 kg)   BMI 57.22 kg/m   Wt Readings from Last 3 Encounters:  01/22/16 293 lb (132.9 kg)  01/05/16 296 lb (134.3 kg)  01/03/16 298 lb 8 oz (135.4 kg)    Physical Exam  Constitutional: She is oriented to person, place, and time. She appears well-developed and well-nourished. No distress.  Well-appearing, comfortable, cooperative, obese  HENT:  Head: Normocephalic and atraumatic.  Mouth/Throat: Oropharynx is clear and moist.  Cardiovascular: Normal rate, regular rhythm, normal heart sounds and intact distal pulses.   No murmur heard. Pulmonary/Chest: Effort normal and breath sounds normal. No respiratory distress. She has no wheezes. She has no rales. She exhibits tenderness (reproducible mid chest and sternal / epigastric chest tenderness).  Abdominal: Soft. Bowel sounds are normal. She exhibits no distension and no mass. There is no tenderness.  Musculoskeletal: Normal range of motion. She exhibits edema (stable, unchanged bilateral lower extremity +1 pitting edema ankles and lower leg with trace). She exhibits no tenderness.  Neurological: She is alert and oriented to person, place, and time.  Skin: Skin is warm and dry. No rash noted. She is not diaphoretic.  Psychiatric: Her behavior is normal.  Nursing note and vitals reviewed.   Results for orders placed or performed during the hospital encounter of 12/23/15  Basic metabolic panel  Result Value Ref Range   Sodium 140 135 - 145 mmol/L   Potassium 3.7 3.5 - 5.1 mmol/L   Chloride 105 101 - 111 mmol/L   CO2 28 22 - 32 mmol/L   Glucose, Bld 159 (H) 65 - 99 mg/dL   BUN 14 6 - 20 mg/dL    Creatinine, Ser 1.611.23 (H) 0.44 - 1.00 mg/dL   Calcium 9.3 8.9 - 09.610.3 mg/dL   GFR calc non Af Amer 50 (L) >60 mL/min   GFR calc Af Amer 57 (L) >60 mL/min   Anion gap 7 5 - 15  CBC  Result Value Ref Range   WBC 8.7 3.6 - 11.0 K/uL   RBC 4.16 3.80 - 5.20 MIL/uL   Hemoglobin 11.9 (L) 12.0 - 16.0 g/dL   HCT 04.535.8 40.935.0 - 81.147.0 %   MCV 86.1 80.0 - 100.0 fL   MCH 28.5 26.0 - 34.0 pg   MCHC 33.1 32.0 - 36.0 g/dL   RDW 91.413.3 78.211.5 - 95.614.5 %   Platelets 305 150 - 440 K/uL  Troponin I  Result Value Ref Range   Troponin I 0.02 <0.03 ng/mL  Hepatic function panel  Result Value Ref Range   Total Protein 7.6 6.5 - 8.1 g/dL   Albumin 2.9 (L) 3.5 - 5.0 g/dL   AST 17 15 - 41 U/L   ALT 13 (L) 14 - 54 U/L   Alkaline Phosphatase 84 38 - 126 U/L   Total Bilirubin 0.1 (L) 0.3 - 1.2 mg/dL   Bilirubin, Direct <2.1<0.1 (L) 0.1 - 0.5 mg/dL   Indirect Bilirubin NOT CALCULATED 0.3 - 0.9 mg/dL  Lipase, blood  Result Value Ref Range   Lipase 16 11 - 51 U/L      Assessment & Plan:   Problem List Items Addressed This Visit    Morbid obesity with BMI of 50.0-59.9, adult (HCC)    Now mild improved with some wt loss on improved lifestyle some regular walking now at goal 3x weekly - Check fasting labs - Follow-up, encouraged lifestyle mods      Gastroesophageal reflux disease without esophagitis    Improved but persistent symptoms concern for cholelithiasis. - Refill Protonix 40mg  daily today - Discontinue Carafate not improving symptoms, unlikely to be PUD      Relevant Medications   pantoprazole (PROTONIX) 40 MG tablet   Essential hypertension - Primary    Mild improvement but still uncontrolled HTN, concern with chronic lack of anti-HTN therapy. Only mild response to initiating ACEi, Thiazide 2 weeks ago. SBP still >160-170 on avg, DBP is controlled now. Home BP checks consistent with manual office re-check.  Plan: 1. Start Amlodipine 10mg  daily, continue lisinopril 20mg  and HCTZ 25mg  daily, future can  consider titrate up Lisinopril to 40mg  daily if still need additional control 2. Check BMET today - ordered last visit but she did not follow-up for labs after new start ACEi, check Na, K, and Cr (last Cr mild elevated 1.23 but no comparison) 3. Continue monitor BP outside office bring log to next visit 4. Continue ASA 81mg  daily 5. Continue improved lifestyle modifications with walking and diet 6. I am not comfortable granting patient cardiac clearance for surgery, proceed with referral to [facility] Cardiology for further evaluation of persistent chest pressure and for cardiac clearance of upcoming tentative elective cholecystectomy, additionally Cardiology can assist with difficult to control HTN 7. Follow-up 4 weeks BP re-check      Relevant Medications   amLODipine (NORVASC) 10 MG tablet   Other Relevant Orders   Ambulatory referral to Cardiology   Elevated serum creatinine    Check BMET today Last Cr 1.23, concern some CKD with uncontrolled HTN and DM      Chronic headache    Likely tension headaches, still HTN uncontrolled - Continue to address underlying factors, no changes to headache treatment today      Relevant Medications   amLODipine (NORVASC) 10 MG tablet   Cholelithiasis    Persistent symptoms without worsening or significant improvement over 2 weeks. No evidence of cholecystitis. - Concern that current symptoms do not seem all entirely related to cholecystitis, concern with uncontrolled HTN among other issues that increase her risk for possible cardiovascular etiology - Received fax from [facility], checked notice that I am not clearing her for cardiac surgery, referral to Cardiology placed, to be faxed back      Chest tightness or pressure    Concern with atypical symptoms of persistent chest pressure, non exertional, is related to eating worsening and positional, some GERD burning symptoms as well, suspect largely related to cholelithiasis, however cannot rule  out cardiac etiology given several uncontrolled risk factors with severe morbid obesity, sedentary lifestyle, uncontrolled HTN, possibly uncontrolled DM (awaiting testing)  Plan: 1. Discussion on ASCVD risks and concern about proceeding with upcoming surgery for elective choley. I am not comfortable granting patient cardiac clearance for surgery, proceed with referral to [facility] Cardiology for further evaluation of persistent chest pressure and for cardiac clearance of upcoming tentative elective cholecystectomy, additionally Cardiology can assist with difficult to control HTN 2. Offered repeat EKG today in office, mutual discussion patient opts to  not repeat this, last done in ED 12/23/15 3. Check labs chemistry 4. Treat underlying HTN, continue ASA daily, discussed most likely will need to start daily statin therapy for reducing ASCVD risk given DM 5. Strict return criteria given when to go to hospital ED if any worsening chest pain pressure or associated symptoms      Relevant Orders   Ambulatory referral to Cardiology     Meds ordered this encounter  Medications  . pantoprazole (PROTONIX) 40 MG tablet    Sig: Take 1 tablet (40 mg total) by mouth daily. 30 min before first meal of day    Dispense:  30 tablet    Refill:  2  . amLODipine (NORVASC) 10 MG tablet    Sig: Take 1 tablet (10 mg total) by mouth daily.    Dispense:  30 tablet    Refill:  5     Follow up plan: Return in about 4 weeks (around 02/19/2016) for blood pressure, DM, lab results.  Saralyn Pilar, DO [facility] 01/22/2016, 7:06 PM

## 2016-01-22 NOTE — Assessment & Plan Note (Addendum)
Mild improvement but still uncontrolled HTN, concern with chronic lack of anti-HTN therapy. Only mild response to initiating ACEi, Thiazide 2 weeks ago. SBP still >160-170 on avg, DBP is controlled now. Home BP checks consistent with manual office re-check.  Plan: 1. Start Amlodipine 10mg  daily, continue lisinopril 20mg  and HCTZ 25mg  daily, future can consider titrate up Lisinopril to 40mg  daily if still need additional control 2. Check BMET today - ordered last visit but she did not follow-up for labs after new start ACEi, check Na, K, and Cr (last Cr mild elevated 1.23 but no comparison) 3. Continue monitor BP outside office bring log to next visit 4. Continue ASA 81mg  daily 5. Continue improved lifestyle modifications with walking and diet 6. I am not comfortable granting patient cardiac clearance for surgery, proceed with [facility] for further evaluation of persistent chest pressure and for cardiac clearance of upcoming tentative elective cholecystectomy, additionally Cardiology can assist with difficult to control HTN 7. Follow-up 4 weeks BP re-check

## 2016-01-22 NOTE — Assessment & Plan Note (Addendum)
Concern with atypical symptoms of persistent chest pressure, non exertional, is related to eating worsening and positional, some GERD burning symptoms as well, suspect largely related to cholelithiasis, however cannot rule out cardiac etiology given several uncontrolled risk factors with severe morbid obesity, sedentary lifestyle, uncontrolled HTN, possibly uncontrolled DM (awaiting testing)  Plan: 1. Discussion on ASCVD risks and concern about proceeding with upcoming surgery for elective choley. I am not comfortable granting patient cardiac clearance for surgery, [facility] with [facility] for further evaluation of persistent chest pressure and for cardiac clearance of upcoming tentative elective cholecystectomy, additionally Cardiology can assist with difficult to control HTN 2. Offered repeat EKG today in office, mutual discussion patient opts to not repeat this, last done in ED 12/23/15 3. Check labs chemistry 4. Treat underlying HTN, continue ASA daily, discussed most likely will need to start daily statin therapy for reducing ASCVD risk given DM 5. Strict return criteria given when to go to hospital ED if any worsening chest pain pressure or associated symptoms

## 2016-01-22 NOTE — Assessment & Plan Note (Signed)
Improved but persistent symptoms concern for cholelithiasis. - Refill Protonix 40mg  daily today - Discontinue Carafate not improving symptoms, unlikely to be PUD

## 2016-01-22 NOTE — Patient Instructions (Addendum)
Thank you for coming in to clinic today.  1. Start new BP pill Amlodipine, same time as others. Keep checking BP and bring log to next visit 2. Continue regular walking exercise  Fasting blood work today. Will follow-up results through MyChart  Continue taking baby ASA 81 daily  For headache continue taking,  taking Tylenol Extra Strength 500mg  tabs - take 1 to 2 tabs (max 1000mg  per dose) every 6 hours for pain (take regularly, don't skip a dose for next 3-7 days), max 24 hour daily dose is 6 to 8 tablets or 3000 to 4000mg   Excedrin migraine as needed  Stop Carafate  Refilled Protonix, continue for few weeks  Contact Dr Excell Seltzerooper office later today. Tell them urgently referred [facility] for further eval of chest pressure symptoms and uncontrolled HTN, now on 3 medications. Will likely need cardiac clearance prior to elective surgery.  If you have any significant chest pain that does not go away within 30 minutes, is accompanied by nausea, sweating, shortness of breath, or made worse by activity, this may be evidence of a heart attack, especially if symptoms worsening instead of improving, please call 911 or go directly to the emergency room immediately for evaluation.  Please schedule a follow-up appointment with Dr. Althea CharonKaramalegos in 4 weeks for BP, Diabetes, lab results  If you have any other questions or concerns, please feel free to call the clinic or send a message through MyChart. You may also schedule an earlier appointment if necessary.  [name] Karamalegos, DO [facility]

## 2016-01-22 NOTE — Telephone Encounter (Signed)
Cardiac Clearance is faxed to Dr. Althea CharonKaramalegos at this time.

## 2016-01-22 NOTE — Assessment & Plan Note (Addendum)
Check BMET today Last Cr 1.23, concern some CKD with uncontrolled HTN and DM

## 2016-01-23 LAB — TSH: TSH: 2.72 mIU/L

## 2016-01-23 NOTE — Telephone Encounter (Signed)
Cardiac Clearance denied from Dr.Karamalegos at this time. Patient needs referral to cardiology for further evaluation of HTN management and chest pressure.  Placed call to Dr.Karamalegos office at this time and spoke with Fleet Contrasachel and was told that nurse is currently working on referral and an appointment with a Cardiologist. She will call and let us know when patien is scheduled.   LVM for patient to call office at this time regarding the above.

## 2016-01-24 ENCOUNTER — Ambulatory Visit (INDEPENDENT_AMBULATORY_CARE_PROVIDER_SITE_OTHER): Payer: BC Managed Care – PPO | Admitting: Surgery

## 2016-01-24 ENCOUNTER — Encounter: Payer: Self-pay | Admitting: Surgery

## 2016-01-24 ENCOUNTER — Telehealth: Payer: Self-pay | Admitting: Cardiology

## 2016-01-24 VITALS — BP 163/90 | HR 79 | Temp 98.3°F | Ht 61.0 in | Wt 294.8 lb

## 2016-01-24 DIAGNOSIS — R079 Chest pain, unspecified: Secondary | ICD-10-CM

## 2016-01-24 NOTE — Progress Notes (Signed)
Outpatient Surgical Follow Up  01/24/2016  Ashley Terry is an 50 y.o. female.   NW:GNFAOCC:chest pain  HPI: This is a patient with chest pain and right upper quadrant and epigastric pain probably associated with gallstones. She has an appointment with a cardiologist tomorrow she's been placed on antihypertensives but her blood pressure is still 90+ diastolic.  Past Medical History:  Diagnosis Date  . Asthma   . Diabetes (HCC)   . Frequent headaches   . High blood pressure   . Seasonal allergies   . Sleep apnea     Past Surgical History:  Procedure Laterality Date  . CESAREAN SECTION  1980  . CESAREAN SECTION  1992  . TONSILECTOMY, ADENOIDECTOMY, BILATERAL MYRINGOTOMY AND TUBES  1980  . TUBAL LIGATION  1994    Family History  Problem Relation Age of Onset  . Diabetes Mother   . Hypertension Mother   . Stroke Father   . Cancer Father     Amyloid  . Pancreatic cancer Maternal Grandmother   . Breast cancer Paternal Grandmother   . Prostate cancer Maternal Grandfather   . Cancer Paternal Grandfather     unknown    Social History:  reports that she has never smoked. She has never used smokeless tobacco. She reports that she does not drink alcohol or use drugs.  Allergies: No Known Allergies  Medications reviewed.   Review of Systems:   Review of Systems  Constitutional: Negative for chills and fever.  HENT: Negative.   Eyes: Negative.   Respiratory: Negative.   Cardiovascular: Positive for chest pain. Negative for claudication.  Gastrointestinal: Positive for abdominal pain, heartburn and nausea. Negative for constipation, diarrhea and vomiting.  Genitourinary: Negative.   Musculoskeletal: Negative.   Skin: Negative.   Neurological: Negative.   Endo/Heme/Allergies: Negative.   Psychiatric/Behavioral: Negative.      Physical Exam:  BP (!) 163/90   Pulse 79   Temp 98.3 F (36.8 C) (Oral)   Ht 5\' 1"  (1.549 m)   Wt 294 lb 12.8 oz (133.7 kg)   BMI 55.70 kg/m    Physical Exam  Constitutional: She is oriented to person, place, and time.  Morbidly obese in no acute distress  HENT:  Head: Normocephalic and atraumatic.  Eyes: Right eye exhibits no discharge. Left eye exhibits no discharge. No scleral icterus.  Pulmonary/Chest: Effort normal. No respiratory distress.  Abdominal: She exhibits no distension. There is tenderness. There is no rebound and no guarding.  Minimal epigastric and right upper quadrant tenderness with a negative Murphy sign  Musculoskeletal: She exhibits no edema or tenderness.  Neurological: She is alert and oriented to person, place, and time.  Skin: Skin is warm and dry.  Psychiatric: Mood and affect normal.  Vitals reviewed.     No results found for this or any previous visit (from the past 48 hour(s)). No results found.  Assessment/Plan:  This a patient with upper abdominal pain likely suggestive of biliary colic but she is also having chest pain and continues to have elevated blood pressure. She's been started on an antihypertensive with improvement in her blood pressure but remains over 90 diastolic. She is seen a cardiologist tomorrow. We will see her back after the cardiologist has completed any testing necessary and deemed her safe for cholecystectomy.  Lattie Hawichard E Dylana Shaw, MD, FACS

## 2016-01-24 NOTE — Telephone Encounter (Signed)
Cardiac Clearance faxed to Dr.Ingal at this time. Patient has an appointment there on 01/25/16 at 2 pm.

## 2016-01-24 NOTE — Patient Instructions (Signed)
Please call our office when the Cardiologist has cleared you for surgery. Please call if you have questions or concerns.

## 2016-01-24 NOTE — Telephone Encounter (Signed)
Cardiac clearance received for patient to have laparoscopic cholecyst[facility] with [facility] at Fax # [phone].

## 2016-01-25 ENCOUNTER — Ambulatory Visit (INDEPENDENT_AMBULATORY_CARE_PROVIDER_SITE_OTHER): Payer: BC Managed Care – PPO | Admitting: Cardiology

## 2016-01-25 ENCOUNTER — Encounter: Payer: Self-pay | Admitting: Cardiology

## 2016-01-25 VITALS — BP 152/100 | HR 89 | Ht 60.0 in | Wt 287.5 lb

## 2016-01-25 DIAGNOSIS — R0789 Other chest pain: Secondary | ICD-10-CM | POA: Diagnosis not present

## 2016-01-25 DIAGNOSIS — I10 Essential (primary) hypertension: Secondary | ICD-10-CM | POA: Diagnosis not present

## 2016-01-25 DIAGNOSIS — Z01810 Encounter for preprocedural cardiovascular examination: Secondary | ICD-10-CM | POA: Diagnosis not present

## 2016-01-25 NOTE — Progress Notes (Signed)
Cardiology Office Note   Date:  01/25/2016   ID:  Ashley Terry, DOB 1965/04/19, MRN [id number]  Referring Doctor:  [name] Karamalegos, DO   Cardiologist:   Almond LintAileen Stefanos Haynesworth, MD   Reason for consultation:  Chief Complaint  Patient presents with  . other    Cardiac clearance for laparscopic cholecystectomy; Dr. Excell Seltzerooper [facility]. Surgery not scheduled yet. Pt. c/o chest heaviness mostly at night when lying flat and in the am when first gets up.  Meds reviewed by the pt. verbally.       History of Present Illness: Ashley Terry is a 50 y.o. female who presents for Preoperative cardiac evaluation, chest pain.  For about a month or so now, patient is noted onset of chest pain. She describes this as a tightness in the chest, 8 out of 10 at the worst, nonradiating, not typically exertional in nature, lasting a few minutes at a time, resolved spontaneously. Mostly noted at night while in bed or first thing in the morning upon waking up. Has not really improved with Protonix. Also has some shortness of breath.  In terms of hypertension, her PCP is managing this. Amlodipine was just started recently. She is starting to note improvement in her blood pressure.  Patient denies PND, orthopnea, edema. No palpitations. No loss of consciousness.   ROS:  Please see the history of present illness. Aside from mentioned under HPI, all other systems are reviewed and negative.     Past Medical History:  Diagnosis Date  . Asthma   . Diabetes (HCC)   . Frequent headaches   . High blood pressure   . Seasonal allergies   . Sleep apnea     Past Surgical History:  Procedure Laterality Date  . CESAREAN SECTION  1980  . CESAREAN SECTION  1992  . TONSILECTOMY, ADENOIDECTOMY, BILATERAL MYRINGOTOMY AND TUBES  1980  . TUBAL LIGATION  1994     reports that she has never smoked. She has never used smokeless tobacco. She reports that she does not drink alcohol or use drugs.    family history includes Breast cancer in her paternal grandmother; Cancer in her father and paternal grandfather; Diabetes in her mother; Hypertension in her mother; Pancreatic cancer in her maternal grandmother; Prostate cancer in her maternal grandfather; Stroke in her father.   Outpatient Medications Prior to Visit  Medication Sig Dispense Refill  . amLODipine (NORVASC) 10 MG tablet Take 1 tablet (10 mg total) by mouth daily. 30 tablet 5  . hydrochlorothiazide (HYDRODIURIL) 25 MG tablet Take 1 tablet (25 mg total) by mouth daily. 30 tablet 5  . lisinopril (PRINIVIL,ZESTRIL) 20 MG tablet Take 1 tablet (20 mg total) by mouth daily. 30 tablet 5  . ondansetron (ZOFRAN ODT) 4 MG disintegrating tablet Take 1 tablet (4 mg total) by mouth every 8 (eight) hours as needed for nausea or vomiting. 30 tablet 0  . pantoprazole (PROTONIX) 40 MG tablet Take 1 tablet (40 mg total) by mouth daily. 30 min before first meal of day 30 tablet 2   No facility-administered medications prior to visit.      Allergies: Patient has no known allergies.    PHYSICAL EXAM: VS:  BP (!) 152/100 (BP Location: Right Wrist, Patient Position: Sitting, Cuff Size: Normal)   Pulse 89   Ht 5' (1.524 m)   Wt 287 lb 8 oz (130.4 kg)   BMI 56.15 kg/m  , Body mass index is 56.15 kg/m. Wt Readings from Last  3 Encounters:  01/25/16 287 lb 8 oz (130.4 kg)  01/24/16 294 lb 12.8 oz (133.7 kg)  01/22/16 293 lb (132.9 kg)    GENERAL:  well developed, well nourished, obese, not in acute distress HEENT: normocephalic, pink conjunctivae, anicteric sclerae, no xanthelasma, normal dentition, oropharynx clear NECK:  no neck vein engorgement, JVP normal, no hepatojugular reflux, carotid upstroke brisk and symmetric, no bruit, no thyromegaly, no lymphadenopathy LUNGS:  good respiratory effort, clear to auscultation bilaterally CV:  PMI not displaced, no thrills, no lifts, S1 and S2 within normal limits, no palpable S3 or S4, no murmurs,  no rubs, no gallops ABD:  Soft, nontender, nondistended, normoactive bowel sounds, no abdominal aortic bruit, no hepatomegaly, no splenomegaly MS: nontender back, no kyphosis, no scoliosis, no joint deformities EXT:  2+ DP/PT pulses, no edema, no varicosities, no cyanosis, no clubbing SKIN: warm, nondiaphoretic, normal turgor, no ulcers NEUROPSYCH: alert, oriented to person, place, and time, sensory/motor grossly intact, normal mood, appropriate affect  Recent Labs: 12/23/2015: ALT 13; Hemoglobin 11.9; Platelets 305 01/22/2016: BUN 20; Creat 1.41; Potassium 4.2; Sodium 139; TSH 2.72   Lipid Panel    Component Value Date/Time   CHOL 270 (H) 01/22/2016 0001   TRIG 260 (H) 01/22/2016 0001   HDL 37 (L) 01/22/2016 0001   CHOLHDL 7.3 (H) 01/22/2016 0001   VLDL 52 (H) 01/22/2016 0001   LDLCALC 181 (H) 01/22/2016 0001     Other studies Reviewed:  EKG:  The ekg from 01/25/2016 was personally reviewed by me and it revealed sinus rhythm, 89 BPM. Left axis deviation/poor R-wave progression.  Additional studies/ records that were reviewed personally reviewed by me today include: None available   ASSESSMENT AND PLAN:  Chest pain Risk factors for CAD include hypertension, hyperlipidemia, morbid obesity, A1c also elevated, possible diabetes Recommend further evaluation with echocardiogram and pharmacologic nuclear stress testing per patient unable to walk and treadmill due to back and joint pains. Continue aspirin 81 mg by mouth daily. Call 911 for unrelenting chest pain.  Preoperative cardiac evaluation We'll need to assess and evaluate chest pain first.  Hypertension Per patient, be managed by PCP. Amlodipine was just started this week. She is seeing improvement in her blood pressure Recommend sodium restriction, dietary changes, last changed. If needed, may utilize nitrate medication for blood pressure control, Imdur ER 30 mg by mouth daily. Patient has not seen a specialist for her  sleep apnea. Will likely benefit from reevaluation for this.  Hyperlipidemia PCP just recently ordered some blood work. I believe this will be managed through PCP. Statin therapy highly recommended.  Current medicines are reviewed at length with the patient today.  The patient does not have concerns regarding medicines.  Labs/ tests ordered today include:  Orders Placed This Encounter  Procedures  . NM Myocar Multi W/Spect W/Wall Motion / EF  . EKG 12-Lead  . ECHOCARDIOGRAM COMPLETE    I had a lengthy and detailed discussion with the patient regarding diagnoses, prognosis, diagnostic options, treatment options , and side effects of medications.   I counseled the patient on importance of lifestyle modification including heart healthy diet, regular physical activity .   Disposition:   FU with undersigned after tests   Signed, Almond LintAileen Tayanna Talford, MD  01/25/2016 3:20 PM    [facility]  This note was generated in part with voice recognition software and I apologize for any typographical errors that were not detected and corrected.

## 2016-01-25 NOTE — Patient Instructions (Addendum)
Testing/Procedures: Your physician has requested that you have an echocardiogram. Echocardiography is a painless test that uses sound waves to create images of your heart. It provides your doctor with information about the size and shape of your heart and how well your heart's chambers and valves are working. This procedure takes approximately one hour. There are no restrictions for this procedure.  [facility] MYOVIEW  Your caregiver has ordered a Stress Test with nuclear imaging. The purpose of this test is to evaluate the blood supply to your heart muscle. This procedure is referred to as a "Non-Invasive Stress Test." This is because other than having an IV started in your vein, nothing is inserted or "invades" your body. Cardiac stress tests are done to find areas of poor blood flow to the heart by determining the extent of coronary artery disease (CAD). Some patients exercise on a treadmill, which naturally increases the blood flow to your heart, while others who are  unable to walk on a treadmill due to physical limitations have a pharmacologic/chemical stress agent called Lexiscan . This medicine will mimic walking on a treadmill by temporarily increasing your coronary blood flow.   Please note: these test may take anywhere between 2-4 hours to complete  PLEASE REPORT TO [facility] MEDICAL MALL ENTRANCE  THE VOLUNTEERS AT THE FIRST DESK WILL DIRECT YOU WHERE TO GO  Date of Procedure:_Monday January 29, 2016 at 09:00AM_  Arrival Time for Procedure:_Arrive at 08:45AM to register____  Instructions regarding medication:   __X__ : Hold diabetes medication Metformin (Glucophage) the morning of procedure  __X__:  Hold hydrochlorothiazide the morning of procedure    PLEASE NOTIFY THE OFFICE AT LEAST 24 HOURS IN ADVANCE IF YOU ARE UNABLE TO KEEP YOUR APPOINTMENT.  [phone] AND  PLEASE NOTIFY NUCLEAR MEDICINE AT [facility] AT LEAST 24 HOURS IN ADVANCE IF YOU ARE UNABLE TO KEEP YOUR APPOINTMENT.  [phone]  How to prepare for your Myoview test:  1. Do not eat or drink after midnight 2. No caffeine for 24 hours prior to test 3. No smoking 24 hours prior to test. 4. Your medication may be taken with water.  If your doctor stopped a medication because of this test, do not take that medication. 5. Ladies, please do not wear dresses.  Skirts or pants are appropriate. Please wear a short sleeve shirt. 6. No perfume, cologne or lotion. 7. Wear comfortable walking shoes. No heels!   Follow-Up: Your physician recommends that you schedule a follow-up appointment after testing with Dr. Alvino Chapel.  It was a pleasure seeing you today here in the office. Please do not hesitate to give [place] a call back if you have any further questions. [phone]  Ursina Cellar RN, BSN     Echocardiogram An echocardiogram, or echocardiography, uses sound waves (ultrasound) to produce an image of your heart. The echocardiogram is simple, painless, obtained within a short period of time, and offers valuable information to your health care provider. The images from an echocardiogram can provide information such as:  Evidence of coronary artery disease (CAD).  Heart size.  Heart muscle function.  Heart valve function.  Aneurysm detection.  Evidence of a past heart attack.  Fluid buildup around the heart.  Heart muscle thickening.  Assess heart valve function. Tell a health care provider about:  Any allergies you have.  All medicines you are taking, including vitamins, herbs, eye drops, creams, and over-the-counter medicines.  Any problems you or family members have had with anesthetic medicines.  Any blood disorders  you have.  Any surgeries you have had.  Any medical conditions you have.  Whether you are pregnant or may be pregnant. What happens before the procedure? No special preparation is needed. Eat and drink normally. What happens during the procedure?  In order to produce an  image of your heart, gel will be applied to your chest and a wand-like tool (transducer) will be moved over your chest. The gel will help transmit the sound waves from the transducer. The sound waves will harmlessly bounce off your heart to allow the heart images to be captured in real-time motion. These images will then be recorded.  You may need an IV to receive a medicine that improves the quality of the pictures. What happens after the procedure? You may return to your normal schedule including diet, activities, and medicines, unless your health care provider tells you otherwise. This information is not intended to replace advice given to you by your health care provider. Make sure you discuss any questions you have with your health care provider. Document Released: 02/09/2000 Document Revised: 09/30/2015 Document Reviewed: 10/19/2012 Elsevier Interactive Patient Education  2017 [employer]vier Inc. Pharmacologic Stress Electrocardiogram A pharmacologic stress electrocardiogram is a heart (cardiac) test that uses nuclear imaging to evaluate the blood supply to your heart. This test may also be called a pharmacologic stress electrocardiography. Pharmacologic means that a medicine is used to increase your heart rate and blood pressure.  This stress test is done to find areas of poor blood flow to the heart by determining the extent of coronary artery disease (CAD). Some people exercise on a treadmill, which naturally increases the blood flow to the heart. For those people unable to exercise on a treadmill, a medicine is used. This medicine stimulates your heart and will cause your heart to beat harder and more quickly, as if you were exercising.  Pharmacologic stress tests can help determine:  The adequacy of blood flow to your heart during increased levels of activity in order to clear you for discharge home.  The extent of coronary artery blockage caused by CAD.  Your prognosis if you have suffered a  heart attack.  The effectiveness of cardiac procedures done, such as an angioplasty, which can increase the circulation in your coronary arteries.  Causes of chest pain or pressure. LET [facility] KNOW ABOUT:  Any allergies you have.  All medicines you are taking, including vitamins, herbs, eye drops, creams, and over-the-counter medicines.  Previous problems you or members of your family have had with the use of anesthetics.  Any blood disorders you have.  Previous surgeries you have had.  Medical conditions you have.  Possibility of pregnancy, if this applies.  If you are currently breastfeeding. RISKS AND COMPLICATIONS Generally, this is a safe procedure. However, as with any procedure, complications can occur. Possible complications include:  You develop pain or pressure in the following areas:  Chest.  Jaw or neck.  Between your shoulder blades.  Radiating down your left arm.  Headache.  Dizziness or light-headedness.  Shortness of breath.  Increased or irregular heartbeat.  Low blood pressure.  Nausea or vomiting.  Flushing.  Redness going up the arm and slight pain during injection of medicine.  Heart attack (rare). BEFORE THE PROCEDURE   Avoid all forms of caffeine for 24 hours before your test or as directed by your health care provider. This includes coffee, tea (even decaffeinated tea), caffeinated sodas, chocolate, cocoa, and certain pain medicines.  Follow your  health care provider's instructions regarding eating and drinking before the test.  Take your medicines as directed at regular times with water unless instructed otherwise. Exceptions may include:  If you have diabetes, ask how you are to take your insulin or pills. It is common to adjust insulin dosing the morning of the test.  If you are taking beta-blocker medicines, it is important to talk to your health care provider about these medicines well before the date of  your test. Taking beta-blocker medicines may interfere with the test. In some cases, these medicines need to be changed or stopped 24 hours or more before the test.  If you wear a nitroglycerin patch, it may need to be removed prior to the test. Ask your health care provider if the patch should be removed before the test.  If you use an inhaler for any breathing condition, bring it with you to the test.  If you are an outpatient, bring a snack so you can eat right after the stress phase of the test.  Do not smoke for 4 hours prior to the test or as directed by your health care provider.  Do not apply lotions, powders, creams, or oils on your chest prior to the test.  Wear comfortable shoes and clothing. Let your health care provider know if you were unable to complete or follow the preparations for your test. PROCEDURE   Multiple patches (electrodes) will be put on your chest. If needed, small areas of your chest may be shaved to get better contact with the electrodes. Once the electrodes are attached to your body, multiple wires will be attached to the electrodes, and your heart rate will be monitored.  An IV access will be started. A nuclear trace (isotope) is given. The isotope may be given intravenously, or it may be swallowed. Nuclear refers to several types of radioactive isotopes, and the nuclear isotope lights up the arteries so that the nuclear images are clear. The isotope is absorbed by your body. This results in low radiation exposure.  A resting nuclear image is taken to show how your heart functions at rest.  A medicine is given through the IV access.  A second scan is done about 1 hour after the medicine injection and determines how your heart functions under stress.  During this stress phase, you will be connected to an electrocardiogram machine. Your blood pressure and oxygen levels will be monitored. AFTER THE PROCEDURE   Your heart rate and blood pressure will be  monitored after the test.  You may return to your normal schedule, including diet,activities, and medicines, unless your health care provider tells you otherwise. This information is not intended to replace advice given to you by your health care provider. Make sure you discuss any questions you have with your health care provider. Document Released: 06/30/2008 Document Revised: 02/16/2013 Document Reviewed: 10/19/2012 Elsevier Interactive Patient Education  2017 [employer]

## 2016-01-29 ENCOUNTER — Ambulatory Visit
Admission: RE | Admit: 2016-01-29 | Discharge: 2016-01-29 | Disposition: A | Discharge status: Home / Self Care | Payer: BC Managed Care – PPO | Source: Ambulatory Visit | Attending: Cardiology | Admitting: Cardiology

## 2016-01-29 DIAGNOSIS — Z01810 Encounter for preprocedural cardiovascular examination: Secondary | ICD-10-CM | POA: Diagnosis not present

## 2016-01-29 DIAGNOSIS — I10 Essential (primary) hypertension: Secondary | ICD-10-CM | POA: Diagnosis not present

## 2016-01-29 DIAGNOSIS — R0789 Other chest pain: Secondary | ICD-10-CM | POA: Insufficient documentation

## 2016-01-29 LAB — NM MYOCAR MULTI W/SPECT W/WALL MOTION / EF
CHL CUP NUCLEAR SRS: 0
CHL CUP NUCLEAR SSS: 17
CHL CUP RESTING HR STRESS: 80 {beats}/min
CSEPHR: 60 %
CSEPPHR: 101 {beats}/min
LV dias vol: 84 mL (ref 46–106)
LV sys vol: 46 mL
SDS: 18
TID: 1.04

## 2016-01-29 MED ORDER — TECHNETIUM TC 99M TETROFOSMIN IV KIT
29.9600 | PACK | Freq: Once | INTRAVENOUS | Status: AC | PRN
  Administered 2016-01-29: 29.96 via INTRAVENOUS

## 2016-01-29 MED ORDER — TECHNETIUM TC 99M TETROFOSMIN IV KIT
12.0200 | PACK | Freq: Once | INTRAVENOUS | Status: AC | PRN
  Administered 2016-01-29: 12.02 via INTRAVENOUS

## 2016-01-29 MED ORDER — REGADENOSON 0.4 MG/5ML IV SOLN
0.4000 mg | Freq: Once | INTRAVENOUS | Status: AC
  Administered 2016-01-29: 0.4 mg via INTRAVENOUS
  Filled 2016-01-29: qty 5

## 2016-01-30 ENCOUNTER — Telehealth: Payer: Self-pay | Admitting: Cardiology

## 2016-01-30 NOTE — Telephone Encounter (Signed)
Received cardiac clearance for patient to have [facility] at Fax number [phone]. Placed form in red folder in "To Do" bin on Samaira Holzworth's desk.

## 2016-01-30 NOTE — Telephone Encounter (Signed)
Cardiac clearance routed through Epic. Original form placed in "Save" bin on Jenesis Suchy's desk.

## 2016-01-31 NOTE — Telephone Encounter (Signed)
Cardiac Clearance obtained from Dr.Aileen Ingal at this time and will be scanned under Media. Please see offive visit notes.

## 2016-01-31 NOTE — Telephone Encounter (Signed)
Medical Clearance received from Dr.Karamalego's at this time and will be scanned under Media. However Dr.karamalego's is deferring medical clearance to cardiology. We have obtained cardiology clearance from Dr.Ingal.

## 2016-02-01 ENCOUNTER — Telehealth: Payer: Self-pay | Admitting: Family Medicine

## 2016-02-01 DIAGNOSIS — N183 Chronic kidney disease, stage 3 (moderate): Principal | ICD-10-CM

## 2016-02-01 DIAGNOSIS — E785 Hyperlipidemia, unspecified: Secondary | ICD-10-CM

## 2016-02-01 DIAGNOSIS — E1169 Type 2 diabetes mellitus with other specified complication: Secondary | ICD-10-CM | POA: Insufficient documentation

## 2016-02-01 DIAGNOSIS — E1122 Type 2 diabetes mellitus with diabetic chronic kidney disease: Secondary | ICD-10-CM

## 2016-02-01 MED ORDER — METFORMIN HCL 500 MG PO TABS: 500.0000 mg | ORAL_TABLET | Freq: Two times a day (BID) | ORAL | 3 refills | Status: DC

## 2016-02-01 MED ORDER — ATORVASTATIN CALCIUM 40 MG PO TABS: 40.0000 mg | ORAL_TABLET | Freq: Every day | ORAL | 5 refills | Status: DC

## 2016-02-01 NOTE — Telephone Encounter (Signed)
Last seen by me in office 01/22/16, see note for details, obtained extensive fasting labs, see results and result note for further comments. Our office has been unable to reach patient regarding her results, however the recommendation was to start on Metformin for chronic uncontrolled diabetes (with A1c 8.4) and start statin medication (Atorvastatin 40mg  nightly) for abnormal cholesterol and reduce ASCVD risk with diabetes.  Will go ahead and [facility]. Please relay this information to patient if she returns call.  1. Start Metformin 500mg  with dinner for 1 week, then increase to 500mg  twice a day with meals (breakfast, dinner), in future will increase dose further as tolerated 2. Start Atorvastatin 40mg  nightly  Follow-up as planned.

## 2016-02-02 ENCOUNTER — Telehealth: Payer: Self-pay | Admitting: Cardiology

## 2016-02-02 ENCOUNTER — Telehealth: Payer: Self-pay

## 2016-02-02 NOTE — Telephone Encounter (Signed)
Pt's mother Ashley Terry notified for test result she is aware about her diabetics and high cholesterol and abnormal kidney function test advised as per Dr. Althea CharonKaramalegos.

## 2016-02-02 NOTE — Telephone Encounter (Signed)
BSA needs the clearance form that was faxed to be signed.  Please call Ashley HatchetSheila.

## 2016-02-02 NOTE — Telephone Encounter (Signed)
Medical Clearance cannot be provided at this time [facility]. He is deferring medical clearance to Cardiology.  Patient has appointment with Dr.Ingal 02/22/16 -Echo and 02/29/16-office visit.

## 2016-02-02 NOTE — Telephone Encounter (Signed)
Signed form was faxed on 01/31/16. Faxed again to [phone].

## 2016-02-02 NOTE — Telephone Encounter (Signed)
Cardiac Clearance is obtained from Dr.Aileen Ingal at this time. Please see office notes 01/30/16-Per Dr.Ingal-No evidence of ischemia, low risk study. Likelihood of clinically significant CAD is low. In terms of preoperative cardiac evaluation, patient is intermediate risk for moderate risk procedure. No ischemia on stress test. LVEF normal on the stress test. No indication for further cardiac testing at this point.

## 2016-02-06 ENCOUNTER — Encounter: Payer: Self-pay | Admitting: *Deleted

## 2016-02-21 ENCOUNTER — Ambulatory Visit (INDEPENDENT_AMBULATORY_CARE_PROVIDER_SITE_OTHER): Payer: BC Managed Care – PPO | Admitting: Family Medicine

## 2016-02-21 ENCOUNTER — Encounter: Payer: Self-pay | Admitting: Family Medicine

## 2016-02-21 VITALS — BP 138/78 | HR 77 | Temp 98.4°F | Resp 16 | Ht 60.0 in | Wt 292.0 lb

## 2016-02-21 DIAGNOSIS — E785 Hyperlipidemia, unspecified: Secondary | ICD-10-CM | POA: Diagnosis not present

## 2016-02-21 DIAGNOSIS — E1169 Type 2 diabetes mellitus with other specified complication: Secondary | ICD-10-CM | POA: Diagnosis not present

## 2016-02-21 DIAGNOSIS — E1122 Type 2 diabetes mellitus with diabetic chronic kidney disease: Secondary | ICD-10-CM

## 2016-02-21 DIAGNOSIS — R7989 Other specified abnormal findings of blood chemistry: Secondary | ICD-10-CM | POA: Diagnosis not present

## 2016-02-21 DIAGNOSIS — I10 Essential (primary) hypertension: Secondary | ICD-10-CM | POA: Diagnosis not present

## 2016-02-21 DIAGNOSIS — N183 Chronic kidney disease, stage 3 (moderate): Secondary | ICD-10-CM

## 2016-02-21 NOTE — Patient Instructions (Signed)
Thank you for coming in to clinic today.  1. For Diabetes - Keep checking blood sugar fasting as you are, I am encouraged this will improve with continued Metformin and improved diabetic diet, and regular exercise, this will be easier to control after your GI issues are resolved - Continue current dose Metformin for now, AFTER your GI surgery and once recovered you can start to gradually increase to 2 pills at night (1000mg ) with dinner and just 1 tab in morning, and then after 1-2 weeks you can go up to 2 pills TWICE a day, 1000mg  twice a day with meals, this is max dose in future we can order rx for higher dose pills if tolerate without upset stomach / diarrhea  2. For BP - Continue checking BP as you are - No change to Bp medications  3. Cholesterol - Likely a genetic component, and some dietary / lifestyle as well - Continue current atorvastatin if well tolerated (if you get muscle aches and pains, let me know may need to adjust dose) - Next check for cholesterol can be in 6 months  4. Future will need pneumonia vaccine and annual eye doctor visit for DM eye check-up  5. You have elevated creatinine or kidney function, most likely some degree of chronic kidney disease from high blood pressure and diabetes, our goal is to control these, and limit any anti-inflammatory (do not take aleve, ibuprofen etc), tylenol is safe, improve your hydration as tolerated. - Most likely will be re-checked before surgery - Otherwise we can re-check again in 3 months and re-evaluate  Please schedule a follow-up appointment with Dr. Althea CharonKaramalegos in 2-3 months (soonest 04/23/16) for DM A1c BP   If you have any other questions or concerns, please feel free to call the clinic or send a message through MyChart. You may also schedule an earlier appointment if necessary.  [name] Skylen Danielsen, DO [facility]

## 2016-02-21 NOTE — Assessment & Plan Note (Signed)
Abnormal TG, LDL 181, low HDL, likely large component with genetic / obesity / limited exercise - Now on statin, tolerating well  Plan: 1. Continue Atorvastatin 40mg  daily 2. Follow-up 3-6 months repeat lipids, future adjust as needed

## 2016-02-21 NOTE — Assessment & Plan Note (Addendum)
Moderately uncontrolled DM, last A1c 8.4, had never been treated previously, without regular medical follow-up, wt gain - Concern with some CKD, as complication with HTN/DM  Plan: 1. Continue Metformin 500mg  BID for now, tolerating well. Advised in future post-op choley ideally can titrate up to 1000mg  BID at next visit if not improving control 2. Discussed future may need additional therapy sulfonylurea / SGLT2 vs GLP1 especially if wt gain is problem 3. Continue lifestyle modifications 4. Continue ASA 81, statin, ACEi 5. DM foot exam today 6. Future follow-up DM eye exam 7. Rec pneumonia vaccine pneumovax [id number] - will get next visit 8. Follow-up 3 months for A1c DM

## 2016-02-21 NOTE — Assessment & Plan Note (Signed)
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

## 2016-02-21 NOTE — Assessment & Plan Note (Signed)
Significantly improved BP, nearly controlled now < 140/90, with home readings confirm improved control, diastolic is controlled, SBP borderline elevated near 140. - Suspected component of CKD secondary to HTN/DM  Plan: 1. No change to meds today - continue Amlodipine 10mg , Lisinopril 20mg , HCTZ 25 - consider combination pill in future for less pill burden, declines today 2. Continue working on lifestyle modifications - ideally once s/p choley and clinically improved, less pain and GI upset then hopefully can improve diet / regular exercise, and BP improve 3. Continue ASA 81 4. Follow-up 3 months, BP, will check BMET to trend Cr

## 2016-02-21 NOTE — Progress Notes (Signed)
Subjective:    Patient ID: Ashley Terry, female    DOB: [birth year], 50 y.o.   MRN: [id number]  Ashley Terry is a 50 y.o. female presenting on 02/21/2016 for Hypertension and Diabetes (highest BS 219 and lowest BS 143 in 1 month )  HPI   CHRONIC DM, Type 2: Reports no new questions or concerns today. CBGs: Avg 160-170, Low 140, High 219. Checks CBGs x 1 daily AM fasting, has readings on phone. Meds: Metformin 500mg  twice a day (tolerating well without GI intolerance, this is new start metformin) Reports good compliance Currently on ACEi Lifestyle: Diet (trying to improve DM diet) / Exercise (trying to increase exercise) Denies hypoglycemia, polyuria, visual changes, numbness or tingling.  Elevated Creatinine - Last checked 01/22/16 at 1.41, up from prior 1 month ago 1.23 (in ED with acute GI problem) - No longer taking NSAIDs   HYPERLIPIDEMIA: - Reports she tries to avoid high fat foods and avoids fried / high cholesterol foods. Last lipids checked with abnormal elevated TG and LDL up to 181, low HDL. - Currently taking Atorvastatin 40mg , started within past 1 month, doing well, tolerating without myalgias  CHRONIC HTN: - Does admit to feeling nauseas after taking pills in AM, with water, does feel some nausea, but does improve later in day - Home BP checks with improved BP ranging 130-150s/70-80s, avg < 140/90 - Admits some swelling in morning sometimes in face, and hands - Taking Lisinopril 20mg , HCTZ 25mg , Amlodipine 10mg  daily, tolerating well - Limited caffeine, never smoker, no alcohol - Significant stress with work - Denies chest pain, dyspnea, syncope, dizziness/lightheadedness  Review of Systems   Per HPI unless specifically indicated above     Objective:    BP 138/78 (BP Location: Left Arm, Cuff Size: Large)   Pulse 77   Temp 98.4 F (36.9 C) (Oral)   Resp 16   Ht 5' (1.524 m)   Wt 292 lb (132.5 kg)   BMI 57.03 kg/m   Wt Readings from Last 3 Encounters:    02/21/16 292 lb (132.5 kg)  01/25/16 287 lb 8 oz (130.4 kg)  01/24/16 294 lb 12.8 oz (133.7 kg)    Physical Exam  Constitutional: She is oriented to person, place, and time. She appears well-developed and well-nourished. No distress.  Well-appearing, comfortable, cooperative, obese  HENT:  Head: Normocephalic and atraumatic.  Mouth/Throat: Oropharynx is clear and moist.  Eyes: Conjunctivae are normal.  Neck: Neck supple. No thyromegaly present.  Cardiovascular: Normal rate, regular rhythm, normal heart sounds and intact distal pulses.   No murmur heard. Pulmonary/Chest: Effort normal and breath sounds normal. No respiratory distress. She has no wheezes. She has no rales.  Musculoskeletal: Normal range of motion. She exhibits edema (stable, unchanged bilateral lower extremity +1 pitting edema ankles and lower leg with trace). She exhibits no tenderness.  Neurological: She is alert and oriented to person, place, and time.  Skin: Skin is warm and dry. No rash noted. She is not diaphoretic.  Psychiatric: Her behavior is normal.  Nursing note and vitals reviewed.  DM FOOT EXAM - normal appearance with some dry skin generalized, no lesions, calluses, or ulcers, intact sensation to monofilament bilaterally except very mild reduced sensation over bilateral heels with some thick dry skin.    Chemistry      Component Value Date/Time   NA 139 01/22/2016 0001   K 4.2 01/22/2016 0001   CL 103 01/22/2016 0001   CO2 29 01/22/2016 0001   BUN  20 01/22/2016 0001   CREATININE 1.41 (H) 01/22/2016 0001      Component Value Date/Time   CALCIUM 9.7 01/22/2016 0001   ALKPHOS 84 12/23/2015 1146   AST 17 12/23/2015 1146   ALT 13 (L) 12/23/2015 1146   BILITOT 0.1 (L) 12/23/2015 1146     Lipid Panel     Component Value Date/Time   CHOL 270 (H) 01/22/2016 0001   TRIG 260 (H) 01/22/2016 0001   HDL 37 (L) 01/22/2016 0001   CHOLHDL 7.3 (H) 01/22/2016 0001   VLDL 52 (H) 01/22/2016 0001   LDLCALC  181 (H) 01/22/2016 0001   A1c 8.4 (01/22/16)    Assessment & Plan:   Problem List Items Addressed This Visit    Hyperlipidemia associated with type 2 diabetes mellitus (HCC)    Abnormal TG, LDL 181, low HDL, likely large component with genetic / obesity / limited exercise - Now on statin, tolerating well  Plan: 1. Continue Atorvastatin 40mg  daily 2. Follow-up 3-6 months repeat lipids, future adjust as needed      Essential hypertension    Significantly improved BP, nearly controlled now < 140/90, with home readings confirm improved control, diastolic is controlled, SBP borderline elevated near 140. - Suspected component of CKD secondary to HTN/DM  Plan: 1. No change to meds today - continue Amlodipine 10mg , Lisinopril 20mg , HCTZ 25 - consider combination pill in future for less pill burden, declines today 2. Continue working on lifestyle modifications - ideally once s/p choley and clinically improved, less pain and GI upset then hopefully can improve diet / regular exercise, and BP improve 3. Continue ASA 81 4. Follow-up 3 months, BP, will check BMET to trend Cr      Relevant Orders   BASIC METABOLIC PANEL WITH GFR   Elevated serum creatinine    Suspected CKD-III in setting of chronic uncontrolled HTN, DM2 - Cr baseline appears 1.2 to 1.4 - Remains off NSAIDs - Continue ACEi, however cautious that this may have raised Cr - Improve hydration - Follow-up 3 months, re-check BMET      Relevant Orders   BASIC METABOLIC PANEL WITH GFR   Diabetes (HCC) - Primary    Moderately uncontrolled DM, last A1c 8.4, had never been treated previously, without regular medical follow-up, wt gain - Concern with some CKD, as complication with HTN/DM  Plan: 1. Continue Metformin 500mg  BID for now, tolerating well. Advised in future post-op choley ideally can titrate up to 1000mg  BID at next visit if not improving control 2. Discussed future may need additional therapy sulfonylurea / SGLT2 vs  GLP1 especially if wt gain is problem 3. Continue lifestyle modifications 4. Continue ASA 81, statin, ACEi 5. DM foot exam today 6. Future follow-up DM eye exam 7. Rec pneumonia vaccine pneumovax PPSV23 - will get next visit 8. Follow-up 3 months for A1c DM      Relevant Orders   BASIC METABOLIC PANEL WITH GFR     No orders of the defined types were placed in this encounter.    Follow up plan: Return in about 2 months (around 04/23/2016) for diabetes, blood pressure.  Additionally, chart review with Cardiology providing medical/cardiac clearance for proceeding with surgery with recent low risk stress test.  [name] Karamalegos, DO [facility] 02/21/2016, 9:49 PM

## 2016-02-22 ENCOUNTER — Ambulatory Visit (INDEPENDENT_AMBULATORY_CARE_PROVIDER_SITE_OTHER): Payer: BC Managed Care – PPO

## 2016-02-22 ENCOUNTER — Other Ambulatory Visit: Payer: Self-pay

## 2016-02-22 DIAGNOSIS — Z01810 Encounter for preprocedural cardiovascular examination: Secondary | ICD-10-CM | POA: Diagnosis not present

## 2016-02-22 DIAGNOSIS — R0789 Other chest pain: Secondary | ICD-10-CM

## 2016-02-22 DIAGNOSIS — I10 Essential (primary) hypertension: Secondary | ICD-10-CM | POA: Diagnosis not present

## 2016-02-22 LAB — ECHOCARDIOGRAM COMPLETE
Ao-asc: 30 cm
E decel time: 250 msec
EERAT: 10.21
FS: 36 % (ref 28–44)
IV/PV OW: 1.14
LA diam end sys: 39 mm
LA diam index: 1.78 cm/m2
LA vol index: 26.8 mL/m2
LASIZE: 39 mm
LAVOL: 58.8 mL
LAVOLA4C: 50.6 mL
LV E/e' medial: 10.21
LV TDI E'LATERAL: 8.27
LV e' LATERAL: 8.27 cm/s
LVEEAVG: 10.21
LVOT VTI: 22.8 cm
LVOT area: 2.84 cm2
LVOT diameter: 19 mm
LVOTPV: 110 cm/s
LVOTSV: 65 mL
MV Dec: 250
MV Peak grad: 3 mmHg
MV pk A vel: 98 m/s
MVPKEVEL: 84.4 m/s
PW: 13 mm — AB (ref 0.6–1.1)
TDI e' medial: 7.62

## 2016-02-28 ENCOUNTER — Telehealth: Payer: Self-pay

## 2016-02-28 ENCOUNTER — Telehealth: Payer: Self-pay | Admitting: Cardiology

## 2016-02-28 ENCOUNTER — Ambulatory Visit (INDEPENDENT_AMBULATORY_CARE_PROVIDER_SITE_OTHER): Payer: BC Managed Care – PPO | Admitting: Surgery

## 2016-02-28 ENCOUNTER — Encounter: Payer: Self-pay | Admitting: Surgery

## 2016-02-28 VITALS — BP 165/90 | HR 84 | Temp 98.0°F | Ht 61.0 in | Wt 293.2 lb

## 2016-02-28 DIAGNOSIS — K8020 Calculus of gallbladder without cholecystitis without obstruction: Secondary | ICD-10-CM

## 2016-02-28 NOTE — Telephone Encounter (Signed)
I don't think we ordered ASA. It is likely for primary prevention. Should be ok to hold

## 2016-02-28 NOTE — Telephone Encounter (Signed)
[name] from Dr. Earmon Phoenixooper's office called wanting to knowVelna Terry if it was ok for patient to hold her anticoagulation for surgery. Patient is only on 81 mg aspirin regimen at this time. Let her know I would forward to Dr. Alvino ChapelIngal. She also states that she received a fax from us but it did not show clearance. Will get Dr. Alvino ChapelIngal to review for holding aspirin and then route both clearance and her recommendations to Fax # [phone]   Notes Recorded by Almond LintAileen Ingal, MD on 01/30/2016 at 11:08 AM EST No evidence of ischemia, low risk study. Likelihood of clinically significant CAD is low. In terms of preoperative cardiac evaluation, patient is intermediate risk for moderate risk procedure. No ischemia on stress test. LVEF normal on the stress test. No indication for further cardiac testing at this point.

## 2016-02-28 NOTE — Telephone Encounter (Signed)
Clearance to stop Anti-coagulant has been faxed to Dr.Aileen Ingal at this time.

## 2016-02-28 NOTE — Patient Instructions (Signed)
Please call our office when you decide to move forward with your surgery. Please refer to the blue pre-care sheet for surgery information.

## 2016-02-28 NOTE — Telephone Encounter (Signed)
Clearance information routed to Orland ColonySheila at number provided.

## 2016-02-28 NOTE — Progress Notes (Signed)
Outpatient Surgical Follow Up  02/28/2016  Ashley Terry is an 51 y.o. female.   CC: Biliary colic  HPI: This a patient with multiple medical problems including diabetes and coronary artery disease. She is seeing a cardiologist now she has hypertension which is being controlled with new medications. She has an appointment with her cardiologist for follow-up after echocardiogram etc. tomorrow.  She describes recurrent right upper quadrant pain often associated fatty food intolerance and workup showing gallstones. She describes near daily upper abdominal and chest pain she relates it sometimes to fatty foods and sometimes to acidic foods but has had some nausea vomiting as well. She denies jaundice or acholic stools  Past Medical History:  Diagnosis Date  . Asthma   . Diabetes (HCC)   . Frequent headaches   . High blood pressure   . Seasonal allergies   . Sleep apnea     Past Surgical History:  Procedure Laterality Date  . CESAREAN SECTION  1980  . CESAREAN SECTION  1992  . TONSILECTOMY, ADENOIDECTOMY, BILATERAL MYRINGOTOMY AND TUBES  1980  . TUBAL LIGATION  1994    Family History  Problem Relation Age of Onset  . Diabetes Mother   . Hypertension Mother   . Stroke Father   . Cancer Father     Amyloid  . Pancreatic cancer Maternal Grandmother   . Breast cancer Paternal Grandmother   . Prostate cancer Maternal Grandfather   . Cancer Paternal Grandfather     unknown    Social History:  reports that she has never smoked. She has never used smokeless tobacco. She reports that she does not drink alcohol or use drugs.  Allergies: No Known Allergies  Medications reviewed.   Review of Systems:   Review of Systems  Constitutional: Negative for chills, fever and weight loss.  HENT: Negative.   Eyes: Negative.   Respiratory: Negative.   Cardiovascular: Positive for chest pain. Negative for palpitations, orthopnea, claudication and PND.  Gastrointestinal: Positive for  abdominal pain, heartburn, nausea and vomiting. Negative for blood in stool, constipation, diarrhea and melena.  Genitourinary: Negative.   Musculoskeletal: Negative.   Skin: Negative.   Neurological: Negative.   Endo/Heme/Allergies: Negative.   Psychiatric/Behavioral: Negative.      Physical Exam:  BP (!) 165/90   Pulse 84   Temp 98 F (36.7 C) (Oral)   Ht 5\' 1"  (1.549 m)   Wt 293 lb 3.2 oz (133 kg)   BMI 55.40 kg/m   Physical Exam  Constitutional: She is oriented to person, place, and time. No distress.  Morbidly obese in no acute distress  HENT:  Head: Normocephalic and atraumatic.  Eyes: Pupils are equal, round, and reactive to light. Right eye exhibits no discharge. Left eye exhibits no discharge. No scleral icterus.  Neck: Normal range of motion.  Cardiovascular: Normal rate, regular rhythm and normal heart sounds.   Pulmonary/Chest: Effort normal and breath sounds normal. No respiratory distress. She has no wheezes. She has no rales.  Abdominal: Soft. She exhibits no distension. There is tenderness. There is no rebound and no guarding.  Minimal tenderness in the epigastrium with negative Murphy sign  Musculoskeletal: Normal range of motion. She exhibits edema.  Lymphadenopathy:    She has no cervical adenopathy.  Neurological: She is alert and oriented to person, place, and time.  Skin: Skin is warm and dry. She is not diaphoretic. No erythema.  Psychiatric: Mood and affect normal.  Vitals reviewed.     No results found  for this or any previous visit (from the past 48 hour(s)). No results found.  Assessment/Plan:  Symptomatically lithiasis and a workup showing gallstones. This is likely biliary colic. I see no sign of acute cholecystitis.  We think we have obtained cardiac clearance but we only have telephone note so far and the patient has an appointment with her cardiologist tomorrow for follow-up on all of her testing. We can go ahead and schedule surgery  require completion of the cardiac clearance process.  I discussed with her the rationale for surgery the options of observation risk bleeding infection recurrence of symptoms failure to resolve all of her symptoms especially if some of this is related to acid. I discussed risk of conversion to an open procedure bile duct damage bile duct leak retained common bile duct stone or bowel injury she understood and agreed to proceed.  Lattie Haw, MD, FACS

## 2016-02-29 ENCOUNTER — Ambulatory Visit: Payer: BC Managed Care – PPO | Admitting: Cardiology

## 2016-03-04 ENCOUNTER — Telehealth: Payer: Self-pay | Admitting: Surgery

## 2016-03-04 NOTE — Telephone Encounter (Signed)
Pt advised of pre op date/time and sx date. Sx: 03/27/16 with Dr Ludwig Clarksooper--Laparoscopic cholecystectomy.  Pre op: 03/20/16 @ 10:15am- Office.   Patient made aware to call [phone], between 1-3:00pm the day before surgery, to find out what time to arrive.    Patient's financial responsibility is 1,[phone] 20% co-insurance 1,250.00 deductible.

## 2016-03-06 ENCOUNTER — Ambulatory Visit (INDEPENDENT_AMBULATORY_CARE_PROVIDER_SITE_OTHER): Payer: BC Managed Care – PPO | Admitting: Cardiology

## 2016-03-06 ENCOUNTER — Encounter: Payer: Self-pay | Admitting: Cardiology

## 2016-03-06 VITALS — BP 120/72 | HR 82 | Ht 60.0 in | Wt 294.5 lb

## 2016-03-06 DIAGNOSIS — Z01810 Encounter for preprocedural cardiovascular examination: Secondary | ICD-10-CM

## 2016-03-06 DIAGNOSIS — E7849 Other hyperlipidemia: Secondary | ICD-10-CM

## 2016-03-06 DIAGNOSIS — I10 Essential (primary) hypertension: Secondary | ICD-10-CM

## 2016-03-06 DIAGNOSIS — E784 Other hyperlipidemia: Secondary | ICD-10-CM | POA: Diagnosis not present

## 2016-03-06 NOTE — Patient Instructions (Signed)
Follow-Up: Your physician recommends that you schedule a follow-up appointment as needed with Dr. Ingal.   It was a pleasure seeing you today here in the office. Please do not hesitate to give us a call back if you have any further questions. [phone]  Aaria Happ A. RN, BSN     

## 2016-03-06 NOTE — Progress Notes (Signed)
Cardiology Office Note   Date:  03/06/2016   ID:  Ashley Terry, DOB 05/26/1965, MRN [id number]  Referring Doctor:  [name] Karamalegos, DO   Cardiologist:   Almond LintAileen Jezreel Justiniano, MD   Reason for consultation:  Chief Complaint  Patient presents with  . other    Follow up from Myoview and Echo. Pt. needs a cardiac clearance for a laparscopic cholescystectomy 03/17/2016. "doing well."       History of Present Illness: Ashley Buffammy Dalporto is a 51 y.o. female who presents for ffup for Preoperative cardiac evaluation  Since last visit, patient has been doing well. She will be talking to her PCP about follow-up for her CPAP machine.  In terms of hypertension, blood pressure continues to be within the normal limits.   In terms of hyperlipidemia, she was just started on atorvastatin recently and we'll continue to follow up with her PCP.   Patient denies PND, orthopnea, edema. No palpitations. No loss of consciousness.   ROS:  Please see the history of present illness. Aside from mentioned under HPI, all other systems are reviewed and negative.     Past Medical History:  Diagnosis Date  . Asthma   . Diabetes (HCC)   . Frequent headaches   . High blood pressure   . Seasonal allergies   . Sleep apnea     Past Surgical History:  Procedure Laterality Date  . CESAREAN SECTION  1980  . CESAREAN SECTION  1992  . TONSILECTOMY, ADENOIDECTOMY, BILATERAL MYRINGOTOMY AND TUBES  1980  . TUBAL LIGATION  1994     reports that she has never smoked. She has never used smokeless tobacco. She reports that she does not drink alcohol or use drugs.   family history includes Breast cancer in her paternal grandmother; Cancer in her father and paternal grandfather; Diabetes in her mother; Hypertension in her mother; Pancreatic cancer in her maternal grandmother; Prostate cancer in her maternal grandfather; Stroke in her father.   Outpatient Medications Prior to Visit  Medication Sig Dispense Refill  .  amLODipine (NORVASC) 10 MG tablet Take 1 tablet (10 mg total) by mouth daily. 30 tablet 5  . aspirin EC 81 MG tablet Take 81 mg by mouth daily.    Marland Kitchen. atorvastatin (LIPITOR) 40 MG tablet Take 1 tablet (40 mg total) by mouth at bedtime. 30 tablet 5  . hydrochlorothiazide (HYDRODIURIL) 25 MG tablet Take 1 tablet (25 mg total) by mouth daily. 30 tablet 5  . lisinopril (PRINIVIL,ZESTRIL) 20 MG tablet Take 1 tablet (20 mg total) by mouth daily. 30 tablet 5  . metFORMIN (GLUCOPHAGE) 500 MG tablet Take 1 tablet (500 mg total) by mouth 2 (two) times daily with a meal. (Start only 1 tab with dinner for 1 week, then increase) 60 tablet 3  . pantoprazole (PROTONIX) 40 MG tablet Take 1 tablet (40 mg total) by mouth daily. 30 min before first meal of day 30 tablet 2   No facility-administered medications prior to visit.      Allergies: Patient has no known allergies.    PHYSICAL EXAM: VS:  BP 120/72 (BP Location: Right Arm, Patient Position: Sitting, Cuff Size: Large)   Pulse 82   Ht 5' (1.524 m)   Wt 294 lb 8 oz (133.6 kg)   BMI 57.52 kg/m  , Body mass index is 57.52 kg/m. Wt Readings from Last 3 Encounters:  03/06/16 294 lb 8 oz (133.6 kg)  02/28/16 293 lb 3.2 oz (133 kg)  02/21/16 292 lb (  132.5 kg)    GENERAL:  well developed, well nourished, morbidly obese, not in acute distress HEENT: normocephalic, pink conjunctivae, anicteric sclerae, no xanthelasma, normal dentition, oropharynx clear NECK:  no neck vein engorgement, JVP normal, no hepatojugular reflux, carotid upstroke brisk and symmetric, no bruit, no thyromegaly, no lymphadenopathy LUNGS:  good respiratory effort, clear to auscultation bilaterally CV:  PMI not displaced, no thrills, no lifts, S1 and S2 within normal limits, no palpable S3 or S4, no murmurs, no rubs, no gallops ABD:  Soft, nontender, nondistended, normoactive bowel sounds, no abdominal aortic bruit, no hepatomegaly, no splenomegaly MS: nontender back, no kyphosis, no  scoliosis, no joint deformities EXT:  2+ DP/PT pulses, no edema, no varicosities, no cyanosis, no clubbing SKIN: warm, nondiaphoretic, normal turgor, no ulcers NEUROPSYCH: alert, oriented to person, place, and time, sensory/motor grossly intact, normal mood, appropriate affect  Recent Labs: 12/23/2015: ALT 13; Hemoglobin 11.9; Platelets 305 01/22/2016: BUN 20; Creat 1.41; Potassium 4.2; Sodium 139; TSH 2.72   Lipid Panel    Component Value Date/Time   CHOL 270 (H) 01/22/2016 0001   TRIG 260 (H) 01/22/2016 0001   HDL 37 (L) 01/22/2016 0001   CHOLHDL 7.3 (H) 01/22/2016 0001   VLDL 52 (H) 01/22/2016 0001   LDLCALC 181 (H) 01/22/2016 0001     Other studies Reviewed:  EKG:  The ekg from 01/25/2016 was personally reviewed by me and it revealed sinus rhythm, 89 BPM. Left axis deviation/poor R-wave progression.  Additional studies/ records that were reviewed personally reviewed by me today include:  Echo 02/14/2016: Left ventricle: The cavity size was normal. There was mild   concentric hypertrophy. Systolic function was normal. The   estimated ejection fraction was in the range of 60% to 65%. Wall   motion was normal; there were no regional wall motion   abnormalities. Doppler parameters are consistent with abnormal   left ventricular relaxation (grade 1 diastolic dysfunction). - Left atrium: The atrium was normal in size. - Right ventricle: Systolic function was normal. - Pulmonary arteries: Systolic pressure was within the normal   range.  Nuclear stress test 01/29/2016:   There was no ST segment deviation noted during stress.  The study is normal.  This is a low risk study. The left ventricular ejection fraction is normal (55-65%).    ASSESSMENT AND PLAN: Preoperative cardiac evaluation Chest pain Risk factors for CAD include hypertension, hyperlipidemia, morbid obesity,  possible diabetes Stress test shows no evidence of ischemia, low risk study. Likelihood of  clinically significant CAD is low. No evidence of congestive heart failure on exam.  In terms of preoperative cardiac evaluation, patient is intermediate cardiac risk for moderate risk procedure. No ischemia on stress test. LVEF normal on the stress test. No indication for further cardiac testing at this point. Will defer preoperative management of her other medical issues to PCP.   Hypertension BP is well controlled. Continue monitoring BP. Continue current medical therapy and lifestyle changes.   Patient has not seen a specialist for her sleep apnea. Will likely benefit from reevaluation for this. Pt to discuss this with her PCP next visit.  Hyperlipidemia Agree with statin therapy. LDL goal < 70. Pt follows up with PCP for this.   Current medicines are reviewed at length with the patient today.  The patient does not have concerns regarding medicines.  Labs/ tests ordered today include:  No orders of the defined types were placed in this encounter.   I counseled the patient on importance of  lifestyle modification including heart healthy diet, regular physical activity .   Disposition:   FU with undersigned prn  Signed, Almond Lint, MD  03/06/2016 11:36 AM    [facility]  This note was generated in part with voice recognition software and I apologize for any typographical errors that were not detected and corrected.

## 2016-03-15 ENCOUNTER — Telehealth: Payer: Self-pay

## 2016-03-15 NOTE — Telephone Encounter (Signed)
Ashley Terry wants to reschedule her surgery because her HR department did not approve those dates for her to take off. They recommended that she has this surgery on the week of 04/15/16. Please call patient and advice.

## 2016-03-18 NOTE — Telephone Encounter (Signed)
I have called patient at all numbers listed to discuss a new surgery date. No answer. I have left a message at each number for patient to call me back to discuss new surgery date.

## 2016-03-18 NOTE — Telephone Encounter (Signed)
Pt advised of pre op date/time and sx date. Sx: 04/17/16 with Dr Ludwig Clarksooper--Laparoscopic cholecystectomy.  Pre op: 04/15/16 @ 10:0am--Office.   Patient also aware of follow up appt with Dr Excell Seltzercooper on 04/15/16 @ 9:15am.  Patient made aware to call [phone], between 1-3:00pm the day before surgery, to find out what time to arrive.

## 2016-03-20 ENCOUNTER — Other Ambulatory Visit: Payer: BC Managed Care – PPO

## 2016-04-01 ENCOUNTER — Telehealth: Payer: Self-pay | Admitting: Surgery

## 2016-04-01 NOTE — Telephone Encounter (Signed)
I have contacted patient.   Patient is aware of her surgery date that has been changed from 04/17/16 to 04/16/16 with Dr Excell Seltzerooper.

## 2016-04-01 NOTE — Telephone Encounter (Signed)
I have called patient multiple times to advise patient that her surgery has been rescheduled from 04/17/16 to 04/16/16. I have not been able to contact patient, several messages have been left on voicemail's without call back. I will continue to try to contact patient.   Phone numbers called--[phone], [phone], [phone].

## 2016-04-15 ENCOUNTER — Encounter
Admission: RE | Admit: 2016-04-15 | Discharge: 2016-04-15 | Disposition: A | Discharge status: Home / Self Care | Payer: BC Managed Care – PPO | Source: Ambulatory Visit | Attending: Surgery | Admitting: Surgery

## 2016-04-15 ENCOUNTER — Ambulatory Visit: Payer: BC Managed Care – PPO | Admitting: Surgery

## 2016-04-15 DIAGNOSIS — K219 Gastro-esophageal reflux disease without esophagitis: Secondary | ICD-10-CM | POA: Diagnosis not present

## 2016-04-15 DIAGNOSIS — J45909 Unspecified asthma, uncomplicated: Secondary | ICD-10-CM | POA: Diagnosis not present

## 2016-04-15 DIAGNOSIS — G4730 Sleep apnea, unspecified: Secondary | ICD-10-CM | POA: Diagnosis not present

## 2016-04-15 DIAGNOSIS — E119 Type 2 diabetes mellitus without complications: Secondary | ICD-10-CM | POA: Diagnosis not present

## 2016-04-15 DIAGNOSIS — K8064 Calculus of gallbladder and bile duct with chronic cholecystitis without obstruction: Secondary | ICD-10-CM | POA: Diagnosis not present

## 2016-04-15 DIAGNOSIS — Z7982 Long term (current) use of aspirin: Secondary | ICD-10-CM | POA: Diagnosis not present

## 2016-04-15 DIAGNOSIS — Z6841 Body Mass Index (BMI) 40.0 and over, adult: Secondary | ICD-10-CM | POA: Diagnosis not present

## 2016-04-15 DIAGNOSIS — I10 Essential (primary) hypertension: Secondary | ICD-10-CM | POA: Diagnosis not present

## 2016-04-15 DIAGNOSIS — Z7984 Long term (current) use of oral hypoglycemic drugs: Secondary | ICD-10-CM | POA: Diagnosis not present

## 2016-04-15 DIAGNOSIS — Z01812 Encounter for preprocedural laboratory examination: Secondary | ICD-10-CM | POA: Insufficient documentation

## 2016-04-15 DIAGNOSIS — I2510 Atherosclerotic heart disease of native coronary artery without angina pectoris: Secondary | ICD-10-CM | POA: Diagnosis not present

## 2016-04-15 DIAGNOSIS — K8050 Calculus of bile duct without cholangitis or cholecystitis without obstruction: Secondary | ICD-10-CM | POA: Insufficient documentation

## 2016-04-15 LAB — CBC WITH DIFFERENTIAL/PLATELET
BASOS ABS: 0.1 10*3/uL (ref 0–0.1)
BASOS PCT: 1 %
EOS ABS: 0.3 10*3/uL (ref 0–0.7)
Eosinophils Relative: 3 %
HCT: 32.4 % — ABNORMAL LOW (ref 35.0–47.0)
HEMOGLOBIN: 10.8 g/dL — AB (ref 12.0–16.0)
Lymphocytes Relative: 22 %
Lymphs Abs: 2.3 10*3/uL (ref 1.0–3.6)
MCH: 28.8 pg (ref 26.0–34.0)
MCHC: 33.4 g/dL (ref 32.0–36.0)
MCV: 86.3 fL (ref 80.0–100.0)
Monocytes Absolute: 0.7 10*3/uL (ref 0.2–0.9)
Monocytes Relative: 6 %
NEUTROS PCT: 68 %
Neutro Abs: 7.3 10*3/uL — ABNORMAL HIGH (ref 1.4–6.5)
Platelets: 379 10*3/uL (ref 150–440)
RBC: 3.75 MIL/uL — ABNORMAL LOW (ref 3.80–5.20)
RDW: 13.6 % (ref 11.5–14.5)
WBC: 10.6 10*3/uL (ref 3.6–11.0)

## 2016-04-15 LAB — COMPREHENSIVE METABOLIC PANEL
ALBUMIN: 3.1 g/dL — AB (ref 3.5–5.0)
ALK PHOS: 104 U/L (ref 38–126)
ALT: 21 U/L (ref 14–54)
ANION GAP: 8 (ref 5–15)
AST: 19 U/L (ref 15–41)
BUN: 21 mg/dL — ABNORMAL HIGH (ref 6–20)
CO2: 27 mmol/L (ref 22–32)
Calcium: 9.3 mg/dL (ref 8.9–10.3)
Chloride: 105 mmol/L (ref 101–111)
Creatinine, Ser: 1.5 mg/dL — ABNORMAL HIGH (ref 0.44–1.00)
GFR calc Af Amer: 45 mL/min — ABNORMAL LOW (ref >60)
GFR calc non Af Amer: 39 mL/min — ABNORMAL LOW (ref >60)
Glucose, Bld: 181 mg/dL — ABNORMAL HIGH (ref 65–99)
POTASSIUM: 3.5 mmol/L (ref 3.5–5.1)
SODIUM: 140 mmol/L (ref 135–145)
Total Bilirubin: 0.3 mg/dL (ref 0.3–1.2)
Total Protein: 8 g/dL (ref 6.5–8.1)

## 2016-04-15 MED ORDER — DEXTROSE 5 % IV SOLN
3.0000 g | INTRAVENOUS | Status: AC
  Administered 2016-04-16: 3 g via INTRAVENOUS
  Filled 2016-04-15: qty 3000

## 2016-04-15 NOTE — Patient Instructions (Signed)
  Your procedure is scheduled on: 04/16/16 Report to Day Surgery. To find out your arrival time please call [phone] between 1PM - 3PM on 04/15/18.  Remember: Instructions that are not followed completely may result in serious medical risk, up to and including death, or upon the discretion of your surgeon and anesthesiologist your surgery may need to be rescheduled.    __x__ 1. Do not eat food or drink liquids after midnight. No gum chewing or hard candies.     __x__ 2. No Alcohol for 24 hours before or after surgery.   ____ 3. Do Not Smoke For 24 Hours Prior to Your Surgery.   ____ 4. Bring all medications with you on the day of surgery if instructed.    __x__ 5. Notify your doctor if there is any change in your medical condition     (cold, fever, infections).       Do not wear jewelry, make-up, hairpins, clips or nail polish.  Do not wear lotions, powders, or perfumes. You may wear deodorant.  Do not shave 48 hours prior to surgery. Men may shave face and neck.  Do not bring valuables to the hospital.    [facility] is not responsible for any belongings or valuables.               Contacts, dentures or bridgework may not be worn into surgery.  Leave your suitcase in the car. After surgery it may be brought to your room.  For patients admitted to the hospital, discharge time is determined by your                treatment team.   Patients discharged the day of surgery will not be allowed to drive home.   Please read over the following fact sheets that you were given:      ___x_ Take these medicines the morning of surgery with A SIP OF WATER:    1. amLODipine (NORVASC) 10 MG tablet  2. lisinopril (PRINIVIL,ZESTRIL) 20 MG tablet  3. pantoprazole (PROTONIX) 40 MG tablet Take one tonight and in the morning.  4.  5.  6.  ____ Fleet Enema (as directed)   ___x_ Use CHG Soap as directed  ____ Use inhalers on the day of surgery  ___x_ Stop metformin today    ____ Take 1/2  of usual insulin dose the night before surgery and none on the morning of surgery.   __x__ Stop aspirin on  today  ___x_ Stop Anti-inflammatories on tylenol only for pain   ____ Stop supplements until after surgery.    __x__ Bring C-Pap to the hospital.

## 2016-04-16 ENCOUNTER — Encounter: Payer: Self-pay | Admitting: *Deleted

## 2016-04-16 ENCOUNTER — Ambulatory Visit: Payer: BC Managed Care – PPO | Admitting: Registered Nurse

## 2016-04-16 ENCOUNTER — Observation Stay
Admission: RE | Admit: 2016-04-16 | Discharge: 2016-04-17 | Disposition: A | Discharge status: Home / Self Care | Payer: BC Managed Care – PPO | Source: Ambulatory Visit | Attending: Surgery | Admitting: Surgery

## 2016-04-16 ENCOUNTER — Encounter
Admission: RE | Disposition: A | Discharge status: Home / Self Care | Payer: Self-pay | Source: Ambulatory Visit | Attending: Surgery

## 2016-04-16 DIAGNOSIS — G4730 Sleep apnea, unspecified: Secondary | ICD-10-CM | POA: Insufficient documentation

## 2016-04-16 DIAGNOSIS — I2510 Atherosclerotic heart disease of native coronary artery without angina pectoris: Secondary | ICD-10-CM | POA: Insufficient documentation

## 2016-04-16 DIAGNOSIS — K8064 Calculus of gallbladder and bile duct with chronic cholecystitis without obstruction: Secondary | ICD-10-CM | POA: Diagnosis not present

## 2016-04-16 DIAGNOSIS — J45909 Unspecified asthma, uncomplicated: Secondary | ICD-10-CM | POA: Insufficient documentation

## 2016-04-16 DIAGNOSIS — Z7984 Long term (current) use of oral hypoglycemic drugs: Secondary | ICD-10-CM | POA: Insufficient documentation

## 2016-04-16 DIAGNOSIS — Z7982 Long term (current) use of aspirin: Secondary | ICD-10-CM | POA: Insufficient documentation

## 2016-04-16 DIAGNOSIS — I10 Essential (primary) hypertension: Secondary | ICD-10-CM | POA: Insufficient documentation

## 2016-04-16 DIAGNOSIS — Z6841 Body Mass Index (BMI) 40.0 and over, adult: Secondary | ICD-10-CM | POA: Insufficient documentation

## 2016-04-16 DIAGNOSIS — E119 Type 2 diabetes mellitus without complications: Secondary | ICD-10-CM | POA: Insufficient documentation

## 2016-04-16 DIAGNOSIS — K8050 Calculus of bile duct without cholangitis or cholecystitis without obstruction: Secondary | ICD-10-CM | POA: Diagnosis not present

## 2016-04-16 DIAGNOSIS — K219 Gastro-esophageal reflux disease without esophagitis: Secondary | ICD-10-CM | POA: Insufficient documentation

## 2016-04-16 HISTORY — PX: CHOLECYSTECTOMY: SHX55

## 2016-04-16 HISTORY — DX: Dyspnea, unspecified: R06.00

## 2016-04-16 HISTORY — DX: Gastro-esophageal reflux disease without esophagitis: K21.9

## 2016-04-16 LAB — GLUCOSE, CAPILLARY
GLUCOSE-CAPILLARY: 188 mg/dL — AB (ref 65–99)
Glucose-Capillary: 168 mg/dL — ABNORMAL HIGH (ref 65–99)

## 2016-04-16 LAB — CBC
HCT: 33.8 % — ABNORMAL LOW (ref 35.0–47.0)
Hemoglobin: 11.2 g/dL — ABNORMAL LOW (ref 12.0–16.0)
MCH: 28.6 pg (ref 26.0–34.0)
MCHC: 33.2 g/dL (ref 32.0–36.0)
MCV: 86.1 fL (ref 80.0–100.0)
PLATELETS: 389 10*3/uL (ref 150–440)
RBC: 3.93 MIL/uL (ref 3.80–5.20)
RDW: 13.2 % (ref 11.5–14.5)
WBC: 17.9 10*3/uL — AB (ref 3.6–11.0)

## 2016-04-16 LAB — TROPONIN I

## 2016-04-16 SURGERY — LAPAROSCOPIC CHOLECYSTECTOMY
Anesthesia: General | Wound class: Clean Contaminated

## 2016-04-16 MED ORDER — ROCURONIUM BROMIDE 100 MG/10ML IV SOLN
INTRAVENOUS | Status: DC | PRN
  Administered 2016-04-16: 30 mg via INTRAVENOUS

## 2016-04-16 MED ORDER — HYDROCHLOROTHIAZIDE 25 MG PO TABS
25.0000 mg | ORAL_TABLET | Freq: Every day | ORAL | Status: DC
  Administered 2016-04-16 – 2016-04-17 (×2): 25 mg via ORAL
  Filled 2016-04-16 (×3): qty 1

## 2016-04-16 MED ORDER — SUCCINYLCHOLINE CHLORIDE 20 MG/ML IJ SOLN
INTRAMUSCULAR | Status: DC | PRN
  Administered 2016-04-16: 120 mg via INTRAVENOUS

## 2016-04-16 MED ORDER — HEPARIN SODIUM (PORCINE) 5000 UNIT/ML IJ SOLN
5000.0000 [IU] | Freq: Three times a day (TID) | INTRAMUSCULAR | Status: DC
  Administered 2016-04-16 – 2016-04-17 (×3): 5000 [IU] via SUBCUTANEOUS
  Filled 2016-04-16 (×3): qty 1

## 2016-04-16 MED ORDER — METHYLPREDNISOLONE SODIUM SUCC 125 MG IJ SOLR
INTRAMUSCULAR | Status: AC
  Filled 2016-04-16: qty 2

## 2016-04-16 MED ORDER — METFORMIN HCL 500 MG PO TABS
500.0000 mg | ORAL_TABLET | Freq: Two times a day (BID) | ORAL | Status: DC
  Administered 2016-04-16 – 2016-04-17 (×2): 500 mg via ORAL
  Filled 2016-04-16 (×2): qty 1

## 2016-04-16 MED ORDER — MORPHINE SULFATE (PF) 4 MG/ML IV SOLN
2.0000 mg | INTRAVENOUS | Status: DC | PRN
  Administered 2016-04-16: 2 mg via INTRAVENOUS
  Filled 2016-04-16: qty 1

## 2016-04-16 MED ORDER — LIDOCAINE HCL (PF) 2 % IJ SOLN
INTRAMUSCULAR | Status: AC
  Filled 2016-04-16: qty 2

## 2016-04-16 MED ORDER — ONDANSETRON HCL 4 MG/2ML IJ SOLN
INTRAMUSCULAR | Status: DC | PRN
  Administered 2016-04-16: 4 mg via INTRAVENOUS

## 2016-04-16 MED ORDER — FENTANYL CITRATE (PF) 100 MCG/2ML IJ SOLN
25.0000 ug | INTRAMUSCULAR | Status: DC | PRN
  Administered 2016-04-16 (×4): 25 ug via INTRAVENOUS

## 2016-04-16 MED ORDER — FENTANYL CITRATE (PF) 100 MCG/2ML IJ SOLN
INTRAMUSCULAR | Status: AC
  Filled 2016-04-16: qty 2

## 2016-04-16 MED ORDER — PANTOPRAZOLE SODIUM 40 MG PO TBEC
40.0000 mg | DELAYED_RELEASE_TABLET | Freq: Every day | ORAL | Status: DC
  Administered 2016-04-17: 40 mg via ORAL
  Filled 2016-04-16: qty 1

## 2016-04-16 MED ORDER — EPHEDRINE SULFATE 50 MG/ML IJ SOLN
INTRAMUSCULAR | Status: AC
  Filled 2016-04-16: qty 1

## 2016-04-16 MED ORDER — SUCCINYLCHOLINE CHLORIDE 20 MG/ML IJ SOLN: INTRAMUSCULAR | Status: DC | PRN

## 2016-04-16 MED ORDER — DEXAMETHASONE SODIUM PHOSPHATE 10 MG/ML IJ SOLN
INTRAMUSCULAR | Status: DC | PRN
  Administered 2016-04-16: 5 mg via INTRAVENOUS

## 2016-04-16 MED ORDER — ATORVASTATIN CALCIUM 40 MG PO TABS
40.0000 mg | ORAL_TABLET | Freq: Every day | ORAL | Status: DC
  Filled 2016-04-16: qty 1

## 2016-04-16 MED ORDER — BUPIVACAINE-EPINEPHRINE (PF) 0.25% -1:200000 IJ SOLN
INTRAMUSCULAR | Status: AC
  Filled 2016-04-16: qty 30

## 2016-04-16 MED ORDER — ATORVASTATIN CALCIUM 20 MG PO TABS
40.0000 mg | ORAL_TABLET | Freq: Every day | ORAL | Status: DC
  Administered 2016-04-16: 40 mg via ORAL
  Filled 2016-04-16: qty 2

## 2016-04-16 MED ORDER — BUPIVACAINE-EPINEPHRINE (PF) 0.25% -1:200000 IJ SOLN
INTRAMUSCULAR | Status: DC | PRN
  Administered 2016-04-16: 30 mL

## 2016-04-16 MED ORDER — PHENYLEPHRINE HCL 10 MG/ML IJ SOLN
INTRAMUSCULAR | Status: AC
  Filled 2016-04-16: qty 1

## 2016-04-16 MED ORDER — SODIUM CHLORIDE 0.9 % IV SOLN
INTRAVENOUS | Status: DC
  Administered 2016-04-16: 50 mL/h via INTRAVENOUS
  Administered 2016-04-16: 18:00:00 via INTRAVENOUS

## 2016-04-16 MED ORDER — FENTANYL CITRATE (PF) 100 MCG/2ML IJ SOLN
INTRAMUSCULAR | Status: DC | PRN
  Administered 2016-04-16 (×3): 50 ug via INTRAVENOUS

## 2016-04-16 MED ORDER — ACETAMINOPHEN 10 MG/ML IV SOLN
INTRAVENOUS | Status: AC
  Filled 2016-04-16: qty 100

## 2016-04-16 MED ORDER — ONDANSETRON HCL 4 MG PO TABS: 4.0000 mg | ORAL_TABLET | Freq: Four times a day (QID) | ORAL | Status: DC | PRN

## 2016-04-16 MED ORDER — METHYLPREDNISOLONE SODIUM SUCC 125 MG IJ SOLR
INTRAMUSCULAR | Status: DC | PRN
  Administered 2016-04-16: 125 mg via INTRAVENOUS

## 2016-04-16 MED ORDER — ALBUTEROL SULFATE HFA 108 (90 BASE) MCG/ACT IN AERS
INHALATION_SPRAY | RESPIRATORY_TRACT | Status: AC
  Filled 2016-04-16: qty 6.7

## 2016-04-16 MED ORDER — MIDAZOLAM HCL 2 MG/2ML IJ SOLN
INTRAMUSCULAR | Status: DC | PRN
  Administered 2016-04-16: 2 mg via INTRAVENOUS

## 2016-04-16 MED ORDER — SUCCINYLCHOLINE CHLORIDE 20 MG/ML IJ SOLN
INTRAMUSCULAR | Status: AC
  Filled 2016-04-16: qty 1

## 2016-04-16 MED ORDER — HYDROCODONE-ACETAMINOPHEN 5-325 MG PO TABS
1.0000 | ORAL_TABLET | ORAL | Status: DC | PRN
  Administered 2016-04-17: 1 via ORAL
  Filled 2016-04-16: qty 1

## 2016-04-16 MED ORDER — LISINOPRIL 20 MG PO TABS
20.0000 mg | ORAL_TABLET | Freq: Every day | ORAL | Status: DC
  Administered 2016-04-17: 20 mg via ORAL
  Filled 2016-04-16: qty 1

## 2016-04-16 MED ORDER — CHLORHEXIDINE GLUCONATE CLOTH 2 % EX PADS: 6.0000 | MEDICATED_PAD | Freq: Once | CUTANEOUS | Status: DC

## 2016-04-16 MED ORDER — ASPIRIN EC 81 MG PO TBEC
81.0000 mg | DELAYED_RELEASE_TABLET | Freq: Every day | ORAL | Status: DC
  Administered 2016-04-16 – 2016-04-17 (×2): 81 mg via ORAL
  Filled 2016-04-16 (×2): qty 1

## 2016-04-16 MED ORDER — ONDANSETRON HCL 4 MG/2ML IJ SOLN: 4.0000 mg | Freq: Once | INTRAMUSCULAR | Status: DC | PRN

## 2016-04-16 MED ORDER — HEPARIN SODIUM (PORCINE) 5000 UNIT/ML IJ SOLN
INTRAMUSCULAR | Status: AC
  Administered 2016-04-16: 5000 [IU] via SUBCUTANEOUS
  Filled 2016-04-16: qty 1

## 2016-04-16 MED ORDER — PROPOFOL 10 MG/ML IV BOLUS
INTRAVENOUS | Status: DC | PRN
  Administered 2016-04-16: 180 mg via INTRAVENOUS

## 2016-04-16 MED ORDER — ACETAMINOPHEN 10 MG/ML IV SOLN
INTRAVENOUS | Status: DC | PRN
  Administered 2016-04-16: 1000 mg via INTRAVENOUS

## 2016-04-16 MED ORDER — SODIUM CHLORIDE 0.9 % IJ SOLN
INTRAMUSCULAR | Status: AC
  Filled 2016-04-16: qty 10

## 2016-04-16 MED ORDER — SUGAMMADEX SODIUM 500 MG/5ML IV SOLN
INTRAVENOUS | Status: AC
  Filled 2016-04-16: qty 5

## 2016-04-16 MED ORDER — SUGAMMADEX SODIUM 500 MG/5ML IV SOLN
INTRAVENOUS | Status: DC | PRN
  Administered 2016-04-16: 300 mg via INTRAVENOUS

## 2016-04-16 MED ORDER — ONDANSETRON HCL 4 MG/2ML IJ SOLN
INTRAMUSCULAR | Status: AC
  Filled 2016-04-16: qty 2

## 2016-04-16 MED ORDER — PROPOFOL 10 MG/ML IV BOLUS
INTRAVENOUS | Status: AC
  Filled 2016-04-16: qty 40

## 2016-04-16 MED ORDER — HEPARIN SODIUM (PORCINE) 5000 UNIT/ML IJ SOLN
5000.0000 [IU] | Freq: Once | INTRAMUSCULAR | Status: AC
  Administered 2016-04-16: 5000 [IU] via SUBCUTANEOUS

## 2016-04-16 MED ORDER — ONDANSETRON HCL 4 MG/2ML IJ SOLN: 4.0000 mg | Freq: Four times a day (QID) | INTRAMUSCULAR | Status: DC | PRN

## 2016-04-16 MED ORDER — ROCURONIUM BROMIDE 50 MG/5ML IV SOLN
INTRAVENOUS | Status: AC
  Filled 2016-04-16: qty 1

## 2016-04-16 MED ORDER — LIDOCAINE HCL (CARDIAC) 20 MG/ML IV SOLN
INTRAVENOUS | Status: DC | PRN
  Administered 2016-04-16: 100 mg via INTRAVENOUS

## 2016-04-16 MED ORDER — AMLODIPINE BESYLATE 10 MG PO TABS
10.0000 mg | ORAL_TABLET | Freq: Every day | ORAL | Status: DC
  Administered 2016-04-17: 10 mg via ORAL
  Filled 2016-04-16: qty 1

## 2016-04-16 MED ORDER — MIDAZOLAM HCL 2 MG/2ML IJ SOLN
INTRAMUSCULAR | Status: AC
  Filled 2016-04-16: qty 2

## 2016-04-16 MED ORDER — FENTANYL CITRATE (PF) 100 MCG/2ML IJ SOLN
INTRAMUSCULAR | Status: AC
  Administered 2016-04-16: 25 ug via INTRAVENOUS
  Filled 2016-04-16: qty 2

## 2016-04-16 MED ORDER — PHENYLEPHRINE HCL 10 MG/ML IJ SOLN
INTRAMUSCULAR | Status: DC | PRN
  Administered 2016-04-16: 50 ug via INTRAVENOUS

## 2016-04-16 MED ORDER — ALBUTEROL SULFATE HFA 108 (90 BASE) MCG/ACT IN AERS
INHALATION_SPRAY | RESPIRATORY_TRACT | Status: DC | PRN
  Administered 2016-04-16: 2 via RESPIRATORY_TRACT

## 2016-04-16 SURGICAL SUPPLY — 46 items
ADHESIVE MASTISOL STRL (MISCELLANEOUS) ×2 IMPLANT
APPLIER CLIP ROT 10 11.4 M/L (STAPLE) ×2
BLADE SURG SZ11 CARB STEEL (BLADE) ×2 IMPLANT
CANISTER SUCT 1200ML W/VALVE (MISCELLANEOUS) ×2 IMPLANT
CATH CHOLANGI 4FR 420404F (CATHETERS) IMPLANT
CHLORAPREP W/TINT 26ML (MISCELLANEOUS) ×2 IMPLANT
CLIP APPLIE ROT 10 11.4 M/L (STAPLE) ×1 IMPLANT
CONRAY 60ML FOR OR (MISCELLANEOUS) IMPLANT
DRAPE C-ARM XRAY 36X54 (DRAPES) IMPLANT
ELECT REM PT RETURN 9FT ADLT (ELECTROSURGICAL) ×2
ELECTRODE REM PT RTRN 9FT ADLT (ELECTROSURGICAL) ×1 IMPLANT
ENDOPOUCH RETRIEVER 10 (MISCELLANEOUS) ×2 IMPLANT
GAUZE SPONGE NON-WVN 2X2 STRL (MISCELLANEOUS) ×4 IMPLANT
GLOVE BIO SURGEON STRL SZ8 (GLOVE) ×10 IMPLANT
GOWN STRL REUS W/ TWL LRG LVL3 (GOWN DISPOSABLE) ×3 IMPLANT
GOWN STRL REUS W/TWL LRG LVL3 (GOWN DISPOSABLE) ×3
IRRIGATION STRYKERFLOW (MISCELLANEOUS) ×1 IMPLANT
IRRIGATOR STRYKERFLOW (MISCELLANEOUS) ×2
IV CATH ANGIO 12GX3 LT BLUE (NEEDLE) IMPLANT
IV NS 1000ML (IV SOLUTION)
IV NS 1000ML BAXH (IV SOLUTION) IMPLANT
JACKSON PRATT 10 (INSTRUMENTS) ×2 IMPLANT
KIT RM TURNOVER STRD PROC AR (KITS) ×2 IMPLANT
LABEL OR SOLS (LABEL) ×2 IMPLANT
NDL SAFETY 22GX1.5 (NEEDLE) ×2 IMPLANT
NEEDLE INSUFFLATION 150MM (ENDOMECHANICALS) ×2 IMPLANT
NEEDLE VERESS 14GA 120MM (NEEDLE) ×2 IMPLANT
NS IRRIG 500ML POUR BTL (IV SOLUTION) ×2 IMPLANT
PACK LAP CHOLECYSTECTOMY (MISCELLANEOUS) ×2 IMPLANT
SCISSORS METZENBAUM CVD 33 (INSTRUMENTS) ×2 IMPLANT
SLEEVE ENDOPATH XCEL 5M (ENDOMECHANICALS) ×4 IMPLANT
SPONGE LAP 18X18 5 PK (GAUZE/BANDAGES/DRESSINGS) ×2 IMPLANT
SPONGE VERSALON 2X2 STRL (MISCELLANEOUS) ×4
SPONGE VERSALON 4X4 4PLY (MISCELLANEOUS) ×2 IMPLANT
STRIP CLOSURE SKIN 1/2X4 (GAUZE/BANDAGES/DRESSINGS) ×2 IMPLANT
SUT ETHILON 3-0 FS-10 30 BLK (SUTURE) ×2
SUT MNCRL 4-0 (SUTURE) ×2
SUT MNCRL 4-0 27XMFL (SUTURE) ×2
SUT VICRYL 0 AB UR-6 (SUTURE) ×2 IMPLANT
SUTURE EHLN 3-0 FS-10 30 BLK (SUTURE) ×1 IMPLANT
SUTURE MNCRL 4-0 27XMF (SUTURE) ×2 IMPLANT
SYR 20CC LL (SYRINGE) IMPLANT
TROCAR 5M 150ML BLDLS (TROCAR) ×2 IMPLANT
TROCAR XCEL NON-BLD 11X100MML (ENDOMECHANICALS) ×2 IMPLANT
TROCAR XCEL NON-BLD 5MMX100MML (ENDOMECHANICALS) ×2 IMPLANT
TUBING INSUFFLATOR HI FLOW (MISCELLANEOUS) ×2 IMPLANT

## 2016-04-16 NOTE — Anesthesia Postprocedure Evaluation (Addendum)
Anesthesia Post Note  Patient: Ashley Terry  Procedure(s) Performed: Procedure(s) (LRB): LAPAROSCOPIC CHOLECYSTECTOMY (N/A)  Patient location during evaluation: PACU Anesthesia Type: General Level of consciousness: awake and alert and oriented Pain management: pain level controlled Vital Signs Assessment: post-procedure vital signs reviewed and stable Respiratory status: spontaneous breathing Cardiovascular status: blood pressure returned to baseline Anesthetic complications: no Comments: Some chest vs. Epigastric pain noted on the floor.  Patient is being evaluated by cardiology.  Prior stress eval done in December showed no ischemia and preserved EF     Last Vitals:  Vitals:   04/16/16 1214 04/16/16 1334  BP: (!) 151/73 (!) 162/81  Pulse: 87 86  Resp: 14 16  Temp: 36.4 C 36.7 C    Last Pain:  Vitals:   04/16/16 1340  TempSrc:   PainSc: 6                  Haeleigh Streiff

## 2016-04-16 NOTE — Progress Notes (Signed)
Postop check. Patient feels much better this afternoon she is having no nausea or vomiting. Vital signs are stable Drain output minimal and sanguinous no bile Nontender calves Patient doing well postop troponins depending

## 2016-04-16 NOTE — Anesthesia Procedure Notes (Signed)
Procedure Name: Intubation Date/Time: 04/16/2016 9:45 AM Performed by: Hedda Slade Pre-anesthesia Checklist: Patient identified, Emergency Drugs available, Suction available and Patient being monitored Patient Re-evaluated:Patient Re-evaluated prior to inductionOxygen Delivery Method: Simple face mask Preoxygenation: Pre-oxygenation with 100% oxygen Intubation Type: IV induction Ventilation: Mask ventilation without difficulty Laryngoscope Size: Mac and 3 Grade View: Grade II Tube size: 7.0 mm Number of attempts: 2 Airway Equipment and Method: Stylet Placement Confirmation: ETT inserted through vocal cords under direct vision,  positive ETCO2 and breath sounds checked- equal and bilateral Secured at: 22 cm Tube secured with: Tape Dental Injury: Teeth and Oropharynx as per pre-operative assessment

## 2016-04-16 NOTE — Progress Notes (Signed)
    Asked by primary team to evaluate patient for chest pain post-operatively. Patient cleared by Dr. Alvino ChapelIngal in the outpatient setting. Recent normal nuclear stress test. Underwent successful laparoscopic cholecystectomy this morning. Post-operatively she developed chest pain that is worse when she coughs and reproducible to palpation. No pre-operative or post-operative EKG for review at time cardiology asked to see patient. No troponin levels.   Patient still with chest pain upon seeing her this afternoon. Pain is worse with deep inspiration and fully reproducible to palpation on exam. Recommend checking stat EKG and cycle troponin levels. Consider D-dimer, though likely to be elevated regardless in the setting of her recent surgery. Will defer this to primary team. If these are abnormal will place full consult, if normal recommend primary team addressing atypical chest pain.    Prior evaluation: TTE 01/2016: Study Conclusions  - Left ventricle: The cavity size was normal. There was mild   concentric hypertrophy. Systolic function was normal. The   estimated ejection fraction was in the range of 60% to 65%. Wall   motion was normal; there were no regional wall motion   abnormalities. Doppler parameters are consistent with abnormal   left ventricular relaxation (grade 1 diastolic dysfunction). - Left atrium: The atrium was normal in size. - Right ventricle: Systolic function was normal. - Pulmonary arteries: Systolic pressure was within the normal   range.  Nuclear stress test 01/2016: Study Result    There was no ST segment deviation noted during stress.  The study is normal.  This is a low risk study.  The left ventricular ejection fraction is normal (55-65%).

## 2016-04-16 NOTE — Op Note (Signed)
Laparoscopic Cholecystectomy  Pre-operative Diagnosis: Biliary colic  Post-operative Diagnosis: Biliary colic  Procedure: Laparoscopic cholecystectomy  Surgeon: Adah Salvageichard E. Excell Seltzerooper, MD FACS  Anesthesia: Gen. with endotracheal tube  Assistant: PA student at surgical tech  Procedure Details  The patient was seen again in the Holding Room. The benefits, complications, treatment options, and expected outcomes were discussed with the patient. The risks of bleeding, infection, recurrence of symptoms, failure to resolve symptoms, bile duct damage, bile duct leak, retained common bile duct stone, bowel injury, any of which could require further surgery and/or ERCP, stent, or papillotomy were reviewed with the patient. The likelihood of improving the patient's symptoms with return to their baseline status is good.  The patient and/or family concurred with the proposed plan, giving informed consent.  The patient was taken to Operating Room, identified as Ashley Terry and the procedure verified as Laparoscopic Cholecystectomy.  A Time Out was held and the above information confirmed.  Prior to the induction of general anesthesia, antibiotic prophylaxis was administered. VTE prophylaxis was in place. General endotracheal anesthesia was then administered and tolerated well. After the induction, the abdomen was prepped with Chloraprep and draped in the sterile fashion. The patient was positioned in the supine position.  Local anesthetic  was injected into the skin near the umbilicus and an incision made. The Veress needle was difficult to place and this was changed to an extra long Veress needle due to the patient's morbid obesity. Pneumoperitoneum was then created with CO2 and tolerated well without any adverse changes in the patient's vital signs. A 5mm extra long port was placed in the periumbilical position and the abdominal cavity was explored. There were adhesions in the area of the periumbilical port site.  Negotiation around these adhesions to view the right upper quadrant was performed. Two 5-mm ports were placed in the right upper quadrant and a 12 mm epigastric port was placed all under direct vision. The camera was placed in the epigastric site to be back in the periumbilical site and the adhesions were noted. It did not appear to contain any bowel and there was no sign of bleeding or bowel injury at this time. All skin incisions  were infiltrated with a local anesthetic agent before making the incision and placing the trocars.   The patient was positioned  in reverse Trendelenburg, tilted slightly to the patient's left.  The gallbladder was identified, the fundus grasped and retracted cephalad. Adhesions were lysed bluntly. In order to identify the infundibulum in this morbidly obese patient the scope was changed to a 30 scope. For additional aid in dissection a third 5 mm right upper quadrant port was placed under direct vision 2 retracted the right upper quadrant viscera in a caudad fashion. This was necessary due to the patient's morbid obesity which made the dissection difficult. The infundibulum was grasped and retracted laterally, exposing the peritoneum overlying the triangle of Calot. This was then divided and exposed in a blunt fashion. A critical view of the cystic duct and cystic artery was obtained.  The cystic duct was clearly identified and bluntly dissected.   The cystic artery was doubly clipped and divided into branches. This allowed for good visualization of the cystic duct as it entered the infundibulum of the gallbladder. Here the infundibulum of the gallbladder was grasped and the cystic duct doubly clipped and divided.  The gallbladder was taken from the gallbladder fossa in a retrograde fashion with the electrocautery. The gallbladder was removed and placed in an  Endocatch bag. The liver bed was irrigated and inspected. Hemostasis was achieved with the electrocautery. Copious  irrigation was utilized and was repeatedly aspirated until clear.  The gallbladder and Endocatch sac were then removed through the epigastric port site.   Small stones were retrieved. The area was irrigated and aspirated. A 10 mm JP drain was placed into the foramen of Winslow and brought out through a lateral port site. It was tied in with 3-0 nylon.  Inspection of the right upper quadrant was performed. No bleeding, bile duct injury or leak, or bowel injury was noted. Pneumoperitoneum was released.  The epigastric port site was closed with figure-of-eight 0 Vicryl sutures. 4-0 subcuticular Monocryl was used to close the skin. Steristrips and Mastisol and sterile dressings were  applied.  The patient was then extubated and brought to the recovery room in stable condition. Sponge, lap, and needle counts were correct at closure and at the conclusion of the case.   Findings: Chronic Cholecystitis   Estimated Blood Loss: 25 cc         Drains: Drain 1 JP         Specimens: Gallbladder           Complications: none               Ashley Terry E. Excell Seltzer, MD, FACS

## 2016-04-16 NOTE — Plan of Care (Signed)
Problem: Activity: Goal: Risk for activity intolerance will decrease Outcome: Progressing Patient ambulating in room and tolerating well.

## 2016-04-16 NOTE — Anesthesia Preprocedure Evaluation (Signed)
Anesthesia Evaluation  Patient identified by MRN, date of birth, ID band Patient awake    Reviewed: Allergy & Precautions, NPO status , Patient's Chart, lab work & pertinent test results  Airway Mallampati: III  TM Distance: <3 FB     Dental  (+) Chipped   Pulmonary shortness of breath and with exertion, asthma , sleep apnea and Continuous Positive Airway Pressure Ventilation ,    Pulmonary exam normal        Cardiovascular hypertension, Pt. on medications Normal cardiovascular exam     Neuro/Psych  Headaches, negative psych ROS   GI/Hepatic Neg liver ROS, GERD  Medicated and Controlled,  Endo/Other  diabetes, Well Controlled, Type 2, Oral Hypoglycemic Agents  Renal/GU negative Renal ROS  negative genitourinary   Musculoskeletal negative musculoskeletal ROS (+)   Abdominal Normal abdominal exam  (+)   Peds negative pediatric ROS (+)  Hematology negative hematology ROS (+)   Anesthesia Other Findings   Reproductive/Obstetrics                             Anesthesia Physical Anesthesia Plan  ASA: III  Anesthesia Plan: General   Post-op Pain Management:    Induction: Intravenous, Rapid sequence and Cricoid pressure planned  Airway Management Planned: Oral ETT  Additional Equipment:   Intra-op Plan:   Post-operative Plan: Extubation in OR  Informed Consent: I have reviewed the patients History and Physical, chart, labs and discussed the procedure including the risks, benefits and alternatives for the proposed anesthesia with the patient or authorized representative who has indicated his/her understanding and acceptance.   Dental advisory given  Plan Discussed with: CRNA and Surgeon  Anesthesia Plan Comments:         Anesthesia Quick Evaluation

## 2016-04-16 NOTE — Op Note (Signed)
Addendum to operative report from today for laparoscopic cholecystectomy  The first extra long 5 mm port was placed via the Visiport technique with the camera under direct vision.

## 2016-04-16 NOTE — H&P (Signed)
Ashley Terry is an 51 y.o. female.    Chief Complaint: Biliary colic  HPI   patient is seen in the preop holding area for an update of the H&P. this a patient with recurrent and episodic right upper quadrant pain associated fatty food intolerance and workup showing gallstones. She is here for elective laparoscopic cholecystectomy for control of her symptoms. She has diabetes and coronary artery disease and has had a workup by her cardiologist.  Past Medical History:  Diagnosis Date  . Asthma   . Diabetes (HCC)   . Dyspnea    with exertion  . Frequent headaches   . GERD (gastroesophageal reflux disease)    takes protonix  . High blood pressure   . Seasonal allergies   . Sleep apnea    CPAP    Past Surgical History:  Procedure Laterality Date  . CESAREAN SECTION  1980  . CESAREAN SECTION  1992  . TONSILECTOMY, ADENOIDECTOMY, BILATERAL MYRINGOTOMY AND TUBES  1980  . TUBAL LIGATION  1994    Family History  Problem Relation Age of Onset  . Diabetes Mother   . Hypertension Mother   . Stroke Father   . Cancer Father     Amyloid  . Pancreatic cancer Maternal Grandmother   . Breast cancer Paternal Grandmother   . Prostate cancer Maternal Grandfather   . Cancer Paternal Grandfather     unknown   Social History:  reports that she has never smoked. She has never used smokeless tobacco. She reports that she does not drink alcohol or use drugs.  Allergies: No Known Allergies  Medications Prior to Admission  Medication Sig Dispense Refill  . acetaminophen (TYLENOL) 500 MG tablet Take 500 mg by mouth every 6 (six) hours as needed for moderate pain.    Marland Kitchen amLODipine (NORVASC) 10 MG tablet Take 1 tablet (10 mg total) by mouth daily. 30 tablet 5  . aspirin EC 81 MG tablet Take 81 mg by mouth daily.    Marland Kitchen aspirin-acetaminophen-caffeine (EXCEDRIN MIGRAINE) 250-250-65 MG tablet Take 2 tablets by mouth every 6 (six) hours as needed for headache or migraine.    . hydrochlorothiazide  (HYDRODIURIL) 25 MG tablet Take 1 tablet (25 mg total) by mouth daily. 30 tablet 5  . lisinopril (PRINIVIL,ZESTRIL) 20 MG tablet Take 1 tablet (20 mg total) by mouth daily. 30 tablet 5  . metFORMIN (GLUCOPHAGE) 500 MG tablet Take 1 tablet (500 mg total) by mouth 2 (two) times daily with a meal. (Start only 1 tab with dinner for 1 week, then increase) 60 tablet 3  . pantoprazole (PROTONIX) 40 MG tablet Take 1 tablet (40 mg total) by mouth daily. 30 min before first meal of day 30 tablet 2  . atorvastatin (LIPITOR) 40 MG tablet Take 1 tablet (40 mg total) by mouth at bedtime. 30 tablet 5     Review of Systems  Constitutional: Negative.   HENT: Negative.   Eyes: Negative.   Respiratory: Negative.   Cardiovascular: Negative.   Gastrointestinal: Positive for abdominal pain and nausea. Negative for vomiting.  Genitourinary: Negative.   Musculoskeletal: Negative.   Skin: Negative.   Neurological: Negative.   Endo/Heme/Allergies: Negative.   Psychiatric/Behavioral: Negative.      Physical Exam:  BP (!) 161/92   Pulse 73   Temp (!) 96.8 F (36 C) (Tympanic)   Resp 16   Ht 5' (1.524 m)   Wt 290 lb (131.5 kg)   SpO2 100%   BMI 56.64 kg/m  Physical Exam  Constitutional: She is oriented to person, place, and time and well-developed, well-nourished, and in no distress.  Morbidly obese no acute distress  HENT:  Head: Normocephalic and atraumatic.  Eyes: Pupils are equal, round, and reactive to light. No scleral icterus.  Neck: Normal range of motion.  Cardiovascular: Normal rate and regular rhythm.   Pulmonary/Chest: Effort normal and breath sounds normal.  Abdominal: Soft. She exhibits no distension. There is no tenderness.  Musculoskeletal: Normal range of motion. She exhibits no edema.  Lymphadenopathy:    She has no cervical adenopathy.  Neurological: She is alert and oriented to person, place, and time.  Skin: Skin is warm and dry.  Psychiatric: Affect normal.  Vitals  reviewed.       Results for orders placed or performed during the hospital encounter of 04/16/16 (from the past 48 hour(s))  Glucose, capillary     Status: Abnormal   Collection Time: 04/16/16  8:39 AM  Result Value Ref Range   Glucose-Capillary 168 (H) 65 - 99 mg/dL   No results found.   Assessment/Plan This patient with recurrent episodic right upper quadrant pain her workup showing gallstones she is here for elective laparoscopic cholecystectomy for control of her symptoms the options of observation of been reviewed the risk bleeding infection recurrence of symptoms failure to resolve her symptoms conversion to an open procedure bile duct damage any of which could require further surgery and/or ERCP stent papillotomy were all discussed with her the potential for bowel injury was also reviewed she understood and agreed to proceed   Lattie Hawichard E Suriyah Vergara, MD, FACS

## 2016-04-16 NOTE — Anesthesia Post-op Follow-up Note (Cosign Needed)
Anesthesia QCDR form completed.        

## 2016-04-16 NOTE — Transfer of Care (Signed)
Immediate Anesthesia Transfer of Care Note  Patient: Ashley Terry  Procedure(s) Performed: Procedure(s): LAPAROSCOPIC CHOLECYSTECTOMY (N/A)  Patient Location: PACU  Anesthesia Type:General  Level of Consciousness: awake, alert  and oriented  Airway & Oxygen Therapy: Patient Spontanous Breathing and Patient connected to face mask oxygen  Post-op Assessment: Report given to RN and Post -op Vital signs reviewed and stable  Post vital signs: Reviewed and stable  Last Vitals:  Vitals:   04/16/16 0835 04/16/16 1054  BP: (!) 161/92 (!) 156/90  Pulse: 73 85  Resp: 16 10  Temp: (!) 36 C 36.5 C    Last Pain:  Vitals:   04/16/16 0835  TempSrc: Tympanic  PainSc: 0-No pain      Patients Stated Pain Goal: 0 (04/16/16 0835)  Complications: No apparent anesthesia complications

## 2016-04-17 ENCOUNTER — Ambulatory Visit: Payer: BC Managed Care – PPO | Admitting: Surgery

## 2016-04-17 DIAGNOSIS — K8064 Calculus of gallbladder and bile duct with chronic cholecystitis without obstruction: Secondary | ICD-10-CM | POA: Diagnosis not present

## 2016-04-17 LAB — COMPREHENSIVE METABOLIC PANEL
ALBUMIN: 2.7 g/dL — AB (ref 3.5–5.0)
ALT: 72 U/L — AB (ref 14–54)
AST: 73 U/L — AB (ref 15–41)
Alkaline Phosphatase: 105 U/L (ref 38–126)
Anion gap: 9 (ref 5–15)
BUN: 27 mg/dL — AB (ref 6–20)
CHLORIDE: 104 mmol/L (ref 101–111)
CO2: 22 mmol/L (ref 22–32)
CREATININE: 1.58 mg/dL — AB (ref 0.44–1.00)
Calcium: 8.8 mg/dL — ABNORMAL LOW (ref 8.9–10.3)
GFR calc Af Amer: 42 mL/min — ABNORMAL LOW (ref >60)
GFR calc non Af Amer: 37 mL/min — ABNORMAL LOW (ref >60)
Glucose, Bld: 304 mg/dL — ABNORMAL HIGH (ref 65–99)
Potassium: 4 mmol/L (ref 3.5–5.1)
SODIUM: 135 mmol/L (ref 135–145)
Total Bilirubin: <0.1 mg/dL — ABNORMAL LOW (ref 0.3–1.2)
Total Protein: 6.9 g/dL (ref 6.5–8.1)

## 2016-04-17 LAB — CBC
HEMATOCRIT: 29.4 % — AB (ref 35.0–47.0)
Hemoglobin: 9.7 g/dL — ABNORMAL LOW (ref 12.0–16.0)
MCH: 28.6 pg (ref 26.0–34.0)
MCHC: 33 g/dL (ref 32.0–36.0)
MCV: 86.6 fL (ref 80.0–100.0)
PLATELETS: 325 10*3/uL (ref 150–440)
RBC: 3.4 MIL/uL — ABNORMAL LOW (ref 3.80–5.20)
RDW: 13.1 % (ref 11.5–14.5)
WBC: 18.7 10*3/uL — ABNORMAL HIGH (ref 3.6–11.0)

## 2016-04-17 LAB — TROPONIN I
Troponin I: <0.03 ng/mL (ref <0.03)
Troponin I: 0.03 ng/mL (ref <0.03)

## 2016-04-17 LAB — GLUCOSE, CAPILLARY: Glucose-Capillary: 195 mg/dL — ABNORMAL HIGH (ref 65–99)

## 2016-04-17 LAB — HIV ANTIBODY (ROUTINE TESTING W REFLEX): HIV SCREEN 4TH GENERATION: NONREACTIVE

## 2016-04-17 LAB — SURGICAL PATHOLOGY

## 2016-04-17 MED ORDER — INSULIN ASPART 100 UNIT/ML ~~LOC~~ SOLN
0.0000 [IU] | Freq: Three times a day (TID) | SUBCUTANEOUS | Status: DC
  Administered 2016-04-17: 3 [IU] via SUBCUTANEOUS
  Filled 2016-04-17: qty 3

## 2016-04-17 MED ORDER — INSULIN ASPART 100 UNIT/ML ~~LOC~~ SOLN: 0.0000 [IU] | Freq: Every day | SUBCUTANEOUS | Status: DC

## 2016-04-17 MED ORDER — HYDROCODONE-ACETAMINOPHEN 5-325 MG PO TABS: 1.0000 | ORAL_TABLET | ORAL | 0 refills | Status: DC | PRN

## 2016-04-17 NOTE — Discharge Summary (Signed)
Patient ID: Ashley Terry MRN: [id number] DOB/AGE: 53-Jan-1967 53 y.o.  Admit date: 04/16/2016 Discharge date: 04/17/2016   Discharge Diagnoses:  Active Problems:   Biliary colic   Procedures: Laparoscopic cholecystectomy by Dr. Fausto Skillern Course: This is a 51 year old female that was scheduled for elective cholecystectomy for biliary colic. She is morbidly obese and underwent a laparoscopic cholecystectomy but this was a challenging case due to her body habitus. She did well postoperatively and was kept overnight. At the time of discharge she was ambulating, tolerating regular diet. Her pain was under control. Her vital signs were stable and she was afebrile. Her physical exam reveals a female in no acute distress. Awake and alert. Abdomen: Soft, dressings in place with no evidence of bleeding or infection. JP serosanguineous output and with 90 cc overnight. No peritonitis. Extremities: Well perfused without edema. Condition of the patient at the time of discharge is stable  Disposition: 01-Home or Self Care  Discharge Instructions    Call MD for:  difficulty breathing, headache or visual disturbances    Complete by:  As directed    Call MD for:  extreme fatigue    Complete by:  As directed    Call MD for:  hives    Complete by:  As directed    Call MD for:  persistant dizziness or light-headedness    Complete by:  As directed    Call MD for:  persistant nausea and vomiting    Complete by:  As directed    Call MD for:  redness, tenderness, or signs of infection (pain, swelling, redness, odor or green/yellow discharge around incision site)    Complete by:  As directed    Call MD for:  severe uncontrolled pain    Complete by:  As directed    Call MD for:  temperature >100.4    Complete by:  As directed    Diet - low sodium heart healthy    Complete by:  As directed    Discharge instructions    Complete by:  As directed    Please teach pt a bout JP care   Increase activity  slowly    Complete by:  As directed    Lifting restrictions    Complete by:  As directed    20 lbs x 6 wks   Remove dressing in 24 hours    Complete by:  As directed      Allergies as of 04/17/2016   No Known Allergies     Medication List    TAKE these medications   acetaminophen 500 MG tablet Commonly known as:  TYLENOL Take 500 mg by mouth every 6 (six) hours as needed for moderate pain.   amLODipine 10 MG tablet Commonly known as:  NORVASC Take 1 tablet (10 mg total) by mouth daily.   aspirin EC 81 MG tablet Take 81 mg by mouth daily.   aspirin-acetaminophen-caffeine 250-250-65 MG tablet Commonly known as:  EXCEDRIN MIGRAINE Take 2 tablets by mouth every 6 (six) hours as needed for headache or migraine.   atorvastatin 40 MG tablet Commonly known as:  LIPITOR Take 1 tablet (40 mg total) by mouth at bedtime.   hydrochlorothiazide 25 MG tablet Commonly known as:  HYDRODIURIL Take 1 tablet (25 mg total) by mouth daily.   HYDROcodone-acetaminophen 5-325 MG tablet Commonly known as:  NORCO/VICODIN Take 1-2 tablets by mouth every 4 (four) hours as needed for moderate pain.   lisinopril 20 MG tablet Commonly known as:  PRINIVIL,ZESTRIL Take 1 tablet (20 mg total) by mouth daily.   metFORMIN 500 MG tablet Commonly known as:  GLUCOPHAGE Take 1 tablet (500 mg total) by mouth 2 (two) times daily with a meal. (Start only 1 tab with dinner for 1 week, then increase)   pantoprazole 40 MG tablet Commonly known as:  PROTONIX Take 1 tablet (40 mg total) by mouth daily. 30 min before first meal of day      Follow-up Information    [facility] Follow up in 1 week(s).   Contact information: [address] [place] [id number] [phone]           Sterling Bigiego Maxine Fredman, MD FACS

## 2016-04-17 NOTE — Progress Notes (Signed)
Notified Dr. Everlene FarrierPabon of elevated troponin 0.03.  MD acknowledged.

## 2016-04-17 NOTE — Progress Notes (Signed)
Patient A&O, pain controlled.  Tolerating diet.  Ambulating in room.  Discharge instructions reviewed in detail including JP drain care/emptying, new medications, and general post operative instructions.  Followup appointment also reviewed with patient.  Understanding was verbalized and all questions were answered.  Patient provided with drain gauze for JP drain care as well.  Patient discharged home via wheelchair in stable condition escorted by volunteer staff.

## 2016-04-23 ENCOUNTER — Encounter: Payer: Self-pay | Admitting: Surgery

## 2016-04-23 ENCOUNTER — Telehealth: Payer: Self-pay

## 2016-04-23 ENCOUNTER — Ambulatory Visit (INDEPENDENT_AMBULATORY_CARE_PROVIDER_SITE_OTHER): Payer: BC Managed Care – PPO | Admitting: Surgery

## 2016-04-23 VITALS — BP 163/84 | HR 86 | Temp 97.6°F | Ht 61.0 in | Wt 293.4 lb

## 2016-04-23 DIAGNOSIS — K8050 Calculus of bile duct without cholangitis or cholecystitis without obstruction: Secondary | ICD-10-CM

## 2016-04-23 NOTE — Patient Instructions (Signed)
Please try Zantac or Prilosec over the counter for your nausea. Please no heavy lifting until 05/14/16. Please do not submerge in water at this time. We have removed your drain today and please allow time for the drain site to close. You will need to apply a bandage until this area has stopped draining and has closed. Please call our if you have questions or concerns.

## 2016-04-23 NOTE — Telephone Encounter (Signed)
FMLA Paperwork has been received and a $25.00 collection fee has been obtained.    

## 2016-04-23 NOTE — Telephone Encounter (Signed)
Called patient but had to leave her a voicemail letting her know that she could either pick up her FMLA or if she could call me with a fax number.

## 2016-04-23 NOTE — Progress Notes (Signed)
04/23/2016  HPI: Patient is status post laparoscopic cholecystectomy on 2/20 with Dr. Excell Seltzerooper for biliary colic. She was discharged on 2/21 with no issues and a JP drain in place.  Patient reports that her right upper quadrant pain is no longer there but she still has some occasional episodes of nausea and bloatedness. She does report a history of acid reflux and indigestion and she does not take any medications for this. Otherwise denies any fevers, chills, chest pain, shortness of breath. JP drain was draining less than 30 cc per day  Vital signs: BP (!) 163/84   Pulse 86   Temp 97.6 F (36.4 C) (Oral)   Ht 5\' 1"  (1.549 m)   Wt 133.1 kg (293 lb 6.4 oz)   BMI 55.44 kg/m    Physical Exam: Constitutional: No acute distress Abdomen:  Soft, nondistended, nontender to palpation. Incisions are clean dry and intact. JP drain in place with serosanguineous fluid draining less than 30 cc per day.  Assessment/Plan: 51 year old female status post laparoscopic cholecystectomy.  -Reassure the patient currently her symptoms may be lingering as her body is getting adjusted to not having any gallbladder and that these should keep improving. Currently there is no need for any imaging studies or laboratory workup. -Recommended the patient start taking Zantac or Prilosec daily. -JP drain removed at bedside with no complications. Dry gauze placed. -Patient may return on an as needed basis.   Howie IllJose Luis Analeigh Aries, MD [facility]

## 2016-04-25 ENCOUNTER — Other Ambulatory Visit: Payer: Self-pay | Admitting: Family Medicine

## 2016-04-25 DIAGNOSIS — K219 Gastro-esophageal reflux disease without esophagitis: Secondary | ICD-10-CM

## 2016-05-02 NOTE — Telephone Encounter (Signed)
Patient came in to pick up her paperwork

## 2016-05-21 ENCOUNTER — Ambulatory Visit: Payer: BC Managed Care – PPO | Admitting: Family Medicine

## 2016-05-21 ENCOUNTER — Other Ambulatory Visit: Payer: Self-pay | Admitting: Family Medicine

## 2016-05-21 ENCOUNTER — Ambulatory Visit (INDEPENDENT_AMBULATORY_CARE_PROVIDER_SITE_OTHER): Payer: BC Managed Care – PPO | Admitting: Family Medicine

## 2016-05-21 ENCOUNTER — Encounter: Payer: Self-pay | Admitting: Family Medicine

## 2016-05-21 VITALS — BP 138/82 | HR 69 | Temp 98.2°F | Resp 16 | Ht 60.0 in | Wt 300.0 lb

## 2016-05-21 DIAGNOSIS — G4733 Obstructive sleep apnea (adult) (pediatric): Secondary | ICD-10-CM | POA: Diagnosis not present

## 2016-05-21 DIAGNOSIS — E1169 Type 2 diabetes mellitus with other specified complication: Secondary | ICD-10-CM | POA: Diagnosis not present

## 2016-05-21 DIAGNOSIS — I10 Essential (primary) hypertension: Secondary | ICD-10-CM | POA: Diagnosis not present

## 2016-05-21 DIAGNOSIS — Z6841 Body Mass Index (BMI) 40.0 and over, adult: Secondary | ICD-10-CM

## 2016-05-21 DIAGNOSIS — E1122 Type 2 diabetes mellitus with diabetic chronic kidney disease: Secondary | ICD-10-CM | POA: Diagnosis not present

## 2016-05-21 DIAGNOSIS — E785 Hyperlipidemia, unspecified: Secondary | ICD-10-CM

## 2016-05-21 DIAGNOSIS — E118 Type 2 diabetes mellitus with unspecified complications: Secondary | ICD-10-CM

## 2016-05-21 DIAGNOSIS — N183 Chronic kidney disease, stage 3 (moderate): Secondary | ICD-10-CM

## 2016-05-21 LAB — POCT GLYCOSYLATED HEMOGLOBIN (HGB A1C): HEMOGLOBIN A1C: 8.2

## 2016-05-21 MED ORDER — METFORMIN HCL 1000 MG PO TABS: 1000.0000 mg | ORAL_TABLET | Freq: Two times a day (BID) | ORAL | 5 refills | Status: DC

## 2016-05-21 MED ORDER — LISINOPRIL-HYDROCHLOROTHIAZIDE 20-25 MG PO TABS: 1.0000 | ORAL_TABLET | Freq: Every day | ORAL | 3 refills | Status: DC

## 2016-05-21 NOTE — Assessment & Plan Note (Signed)
Worsening weight gain 7-10 lbs in 1 month, attributed to post-op period, limited exercise with reduced energy - Encouraged gradually resume activity/exercise may improve her energy, reviewed regular DM diet advice, handout given - Recommend nutritionist referral [facility], patient declined today will consider if needed - Concern with possible OSA symptoms with fatigue, and may need new CPAP, asked to contact med supply company for new supplies vs insurance to find coverage for [facility] vs home

## 2016-05-21 NOTE — Progress Notes (Signed)
Subjective:    Patient ID: Ashley Terry, female    DOB: January 27, 1966, 51 y.o.   MRN: [id number]  Ashley Terry is a 51 y.o. female presenting on 05/21/2016 for Hypertension and Diabetes  HPI   CHRONIC DM, Type 2: Reports no concerns today. She has had her gallbladder surgery, overall feels better but still has episodes of nausea and feels sick, worst with taking pills in AM. Prior A1c 8.4 now down to 8.2 CBGs: Avg 160, Low 130, High 189. Checks CBGs x 1 daily AM fasting Meds: Metformin 500mg  twice a day (tolerating well without GI intolerance, did not titrate up to 1000mg  BID given recent surgery) Reports good compliance Currently on ACEi Lifestyle: Diet (trying to improve DM diet) / Exercise (wants to resume regular exercise but has been tired post-op and limited energy) - Admits some stress following recent surgery, worst sugars with stress - Not interested in referral to [facility] nutritionist, she has seen one in past already and knows appropriate diet  Denies hypoglycemia, polyuria, visual changes, numbness or tingling.  Elevated Creatinine with presumed CKD-III - Interval history, has had several lab draws with persistently elevated Creatinine previously Cr 1.2 to 1.4, recently has had 1.5 to 1.58. Other than surgery no significant change. - No longer taking NSAIDs   HYPERLIPIDEMIA / MORBID OBESITY BMI >58 - Last fasting lipids 12/2015 - Admits she has tried to improve diet, was advised to follow special diet after surgery, but not adhering to it regularly - Currently taking Atorvastatin 40mg , doing well, tolerating without myalgias - Still weight gain, now +7-10 lbs in 1 month, attributes some of this to stress with work and some dietary changes, but has not resumed regular exercise post-op  CHRONIC HTN / OSA on CPAP - Reports no new concerns but still admits BP remains elevated at times. Home BP checks with BP ranging 140-150s (infrequently 170 due to stress)/70-80s.  Overall she still feels much better than previously when had uncontrolled BP - Taking Lisinopril 20mg , HCTZ 25mg , Amlodipine 10mg  daily, tolerating well - Limited caffeine, never smoker, no alcohol - Still significant stress at work - Has old CPAP with prior dx OSA, but last sleep study many years ago by report, and has concerns with effectiveness of CPAP - Denies chest pain, dyspnea, syncope, dizziness/lightheadedness  Review of Systems Per HPI unless specifically indicated above     Objective:    BP 138/82 (BP Location: Left Arm, Cuff Size: Large)   Pulse 69   Temp 98.2 F (36.8 C) (Oral)   Resp 16   Ht 5' (1.524 m)   Wt 300 lb (136.1 kg)   BMI 58.59 kg/m   Wt Readings from Last 3 Encounters:  05/21/16 300 lb (136.1 kg)  04/23/16 293 lb 6.4 oz (133.1 kg)  04/16/16 290 lb (131.5 kg)    Physical Exam  Constitutional: She is oriented to person, place, and time. She appears well-developed and well-nourished. No distress.  Well-appearing, comfortable, cooperative, obese  HENT:  Head: Normocephalic and atraumatic.  Mouth/Throat: Oropharynx is clear and moist.  Eyes: Conjunctivae are normal.  Neck: Normal range of motion. Neck supple. No thyromegaly present.  Cardiovascular: Normal rate, regular rhythm, normal heart sounds and intact distal pulses.   No murmur heard. Pulmonary/Chest: Effort normal and breath sounds normal. No respiratory distress. She has no wheezes. She has no rales.  Musculoskeletal: Normal range of motion. She exhibits edema (stable, unchanged bilateral lower extremity +1 pitting edema ankles and lower leg  with trace). She exhibits no tenderness.  Neurological: She is alert and oriented to person, place, and time.  Skin: Skin is warm and dry. She is not diaphoretic.  Psychiatric: Her behavior is normal.  Nursing note and vitals reviewed.  Results for orders placed or performed in visit on 05/21/16 (from the past 24 hour(s))  POCT HgB A1C     Status:  Abnormal   Collection Time: 05/21/16  9:11 AM  Result Value Ref Range   Hemoglobin A1C 8.2      Assessment & Plan:   Problem List Items Addressed This Visit    Sleep apnea    Recent concern with possible CPAP mask seems less effective, complaints of air leak, states machine is old, and prior sleep study many years ago, asking about upgrade - May be contributing to fatigue, decreased energy, elevated BP - Advised to check with med supply company [employer], then contact insurance, may need new sleep study (center vs home)      Morbid obesity with BMI of 50.0-59.9, adult (HCC)    Worsening weight gain 7-10 lbs in 1 month, attributed to post-op period, limited exercise with reduced energy - Encouraged gradually resume activity/exercise may improve her energy, reviewed regular DM diet advice, handout given - Recommend nutritionist referral [facility], patient declined today will consider if needed - Concern with possible OSA symptoms with fatigue, and may need new CPAP, asked to contact med supply company for new supplies vs insurance to find coverage for [facility] vs home      Relevant Medications   metFORMIN (GLUCOPHAGE) 1000 MG tablet   Hyperlipidemia associated with type 2 diabetes mellitus (HCC)    Last lipids 12/2015 with abnormal TG, LDL 181, and low HDL  Plan: 1. Reviewed her recent weight gain with concerns of HLD, DM, HTN - advised appropriate dietary changes, regular exercise 2. Continue Atorvastatin 40mg  daily 3. Defer repeat labs today - can check fasting lipid in future, likely 12/2016 with annual physical labs      Relevant Medications   metFORMIN (GLUCOPHAGE) 1000 MG tablet   lisinopril-hydrochlorothiazide (PRINZIDE,ZESTORETIC) 20-25 MG tablet   Essential hypertension    Mildly elevated BP, overall remains improved from previously - Complication with CKD-III and sub-optimal control DM2  Plan: 1. Change rx today to combination pill  Lisinopril-HCTZ 20-25mg  daily, to lessen pill burden, continue Amlodipine 10mg  daily 2. Encourage to resume regular exercise plan 3. Discussion on may need 4th BP agent in future if becomes uncontrolled, stressed importance of weight management 4. Continue ASA 81, statin 5. Follow-up 3 months HTN - can re-check BMET if needed, she has had pre/post op chemistry since last apt with me and had Cr checked - if continues to elevate, then consider referral to Nephrology for evaluation      Relevant Medications   lisinopril-hydrochlorothiazide (PRINZIDE,ZESTORETIC) 20-25 MG tablet   Diabetes (HCC) - Primary    Slightly improvement, but remains sub-optimal control DM, A1c down to 8.2 from 8.4 - Concern with complication with CKD-III  Plan: 1. Increase Metformin to 1000mg  BID - did not titrate up as previously advised, sent new rx 1000mg  tabs, start 500 AM / 1000mg  PM 1-2 weeks as tolerated then increase to 1000mg  BID 2. Again encouraged healthy DM diet, resume regular exercise, consider nutritionist 3. Discussed again that may need additional therapy - emphasis today on GLP1, seems less likely candidate for SGLT2 with CKD and likely more significant benefit from GLP1 than sulfonylurea 4. Continue ASA  81, statin, ACEi 5. Reminded again to schedule future yearly DM eye exam 7. Rec pneumonia vaccine pneumovax PPSV23 - will get next visit 8. Follow-up 3 months for A1c DM      Relevant Medications   metFORMIN (GLUCOPHAGE) 1000 MG tablet   lisinopril-hydrochlorothiazide (PRINZIDE,ZESTORETIC) 20-25 MG tablet   Other Relevant Orders   POCT HgB A1C (Completed)     Meds ordered this encounter  Medications  . metFORMIN (GLUCOPHAGE) 1000 MG tablet    Sig: Take 1 tablet (1,000 mg total) by mouth 2 (two) times daily with a meal.    Dispense:  60 tablet    Refill:  5  . lisinopril-hydrochlorothiazide (PRINZIDE,ZESTORETIC) 20-25 MG tablet    Sig: Take 1 tablet by mouth daily.    Dispense:  90 tablet     Refill:  3     Follow up plan: Return in about 3 months (around 08/21/2016) for diabetes, blood pressure.  [name] Jeri Jeanbaptiste, DO [facility] 05/21/2016, 11:48 PM

## 2016-05-21 NOTE — Patient Instructions (Signed)
Thank you for coming in to clinic today.  1. For Diabetes - A1c down from 8.4 to 8.2, keep up good work - [employer] data process[employer]courage to work on lifestyle more with starting some regular exercise 1-2 days a week and work your way up [name]- Start to gradually increase to 2 pills at night (1000mg ) with dinner and just 1 tab in morning, and then after 1-2 weeks you can go up to 2 pills TWICE a day, 1000mg  twice a day with meals, this is max dose in future we can order rx for higher dose pills if tolerate without upset stomach / diarrhea  Other medicines, injectables to consider - check insurance coverage #1 - Bydureon BCise (Exenatide) - once weekly #2 - Trulicity (Dulaglutide) - once weekly #3 - Victoza (Liraglutide) - daily  2. For BP - Continue checking BP as you are - Discontinued Lisinopril / HCTZ individual rx - > new rx for combo pill ONCE daily  Eat at least 3 meals and 1-2 snacks per day (don't skip breakfast).  Aim for no more than 5 hours between eating. - Tip: If you go >5 hours without eating and become very hungry, your body will supply it's own resources temporarily and you can gain extra weight when you eat.  The 5 Minute Rule of Exercise - Promise yourself to at least do 5 minutes of exercise (make sure you time it), and if at the end of 5 minutes (this is the hardest part of the work-out), if you still feel like you want stop (or not motivated to continue) then allow yourself to stop. Otherwise, more often than not you will feel encouraged that you can continue for a little while longer or even more!  Diet Recommendations for Diabetes   Limit Starchy (carb) foods include: Bread, rice, pasta, potatoes, corn, crackers, bagels, muffins, all baked goods.   Continue eating Protein foods include: Meat, fish, poultry, eggs, dairy foods, and beans such as pinto and kidney beans (beans also provide carbohydrate).   1. Eat at least 3 meals and 1-2 snacks per day. Never go more than 4-5 hours while awake  without eating.   2. Limit starchy foods to TWO per meal and ONE per snack. ONE portion of a starchy  food is equal to the following:   - ONE slice of bread (or its equivalent, such as half of a hamburger bun).   - 1/2 cup of a "scoopable" starchy food such as potatoes or rice.   - 1 OUNCE (28 grams) of starchy snacks (crackers or pretzels, look on label).   - 15 grams of carbohydrate as shown on food label.   3. Both lunch and dinner should include a protein food, a carb food, and vegetables.   - Obtain twice as many veg's as protein or carbohydrate foods for both lunch and dinner.   - Try to keep frozen veg's on hand for a quick vegetable serving.     - Fresh or frozen veg's are best.   4. Breakfast should always include protein.    -----  Future annual eye doctor visit for DM eye check-up   Please schedule a follow-up appointment with Dr. Althea CharonKaramalegos in 3 months for DM A1c, HTN   If you have any other questions or concerns, please feel free to call the clinic or send a message through MyChart. You may also schedule an earlier appointment if necessary.  Ashley [name], DO [facility]

## 2016-05-21 NOTE — Assessment & Plan Note (Signed)
Slightly improvement, but remains sub-optimal control DM, A1c down to 8.2 from 8.4 - Concern with complication with CKD-III  Plan: 1. Increase Metformin to 1000mg  BID - did not titrate up as previously advised, sent new rx 1000mg  tabs, start 500 AM / 1000mg  PM 1-2 weeks as tolerated then increase to 1000mg  BID 2. Again encouraged healthy DM diet, resume regular exercise, consider nutritionist 3. Discussed again that may need additional therapy - emphasis today on GLP1, seems less likely candidate for SGLT2 with CKD and likely more significant benefit from GLP1 than sulfonylurea 4. Continue ASA 81, statin, ACEi 5. Reminded again to schedule future yearly DM eye exam 7. Rec pneumonia vaccine pneumovax PPSV23 - will get next visit 8. Follow-up 3 months for A1c DM

## 2016-05-21 NOTE — Assessment & Plan Note (Signed)
Last lipids 12/2015 with abnormal TG, LDL 181, and low HDL  Plan: 1. Reviewed her recent weight gain with concerns of HLD, DM, HTN - advised appropriate dietary changes, regular exercise 2. Continue Atorvastatin 40mg  daily 3. Defer repeat labs today - can check fasting lipid in future, likely 12/2016 with annual physical labs

## 2016-05-21 NOTE — Assessment & Plan Note (Signed)
Recent concern with possible CPAP mask seems less effective, complaints of air leak, states machine is old, and prior sleep study many years ago, asking about upgrade - May be contributing to fatigue, decreased energy, elevated BP - Advised to check with med supply company [employer], then contact insurance, may need new sleep study (center vs home)

## 2016-05-21 NOTE — Assessment & Plan Note (Addendum)
Mildly elevated BP, overall remains improved from previously - Complication with CKD-III and sub-optimal control DM2  Plan: 1. Change rx today to combination pill Lisinopril-HCTZ 20-25mg  daily, to lessen pill burden, continue Amlodipine 10mg  daily 2. Encourage to resume regular exercise plan 3. Discussion on may need 4th BP agent in future if becomes uncontrolled, stressed importance of weight management 4. Continue ASA 81, statin 5. Follow-up 3 months HTN - can re-check BMET if needed, she has had pre/post op chemistry since last apt with me and had Cr checked - if continues to elevate, then consider referral to Nephrology for evaluation

## 2016-05-25 ENCOUNTER — Other Ambulatory Visit: Payer: Self-pay | Admitting: Family Medicine

## 2016-05-25 DIAGNOSIS — N183 Chronic kidney disease, stage 3 (moderate): Principal | ICD-10-CM

## 2016-05-25 DIAGNOSIS — E1122 Type 2 diabetes mellitus with diabetic chronic kidney disease: Secondary | ICD-10-CM

## 2016-07-19 ENCOUNTER — Other Ambulatory Visit: Payer: Self-pay | Admitting: Family Medicine

## 2016-07-19 DIAGNOSIS — I10 Essential (primary) hypertension: Secondary | ICD-10-CM

## 2016-07-19 DIAGNOSIS — E1169 Type 2 diabetes mellitus with other specified complication: Secondary | ICD-10-CM

## 2016-07-19 DIAGNOSIS — E785 Hyperlipidemia, unspecified: Secondary | ICD-10-CM

## 2016-07-19 DIAGNOSIS — K219 Gastro-esophageal reflux disease without esophagitis: Secondary | ICD-10-CM

## 2016-08-23 ENCOUNTER — Ambulatory Visit (INDEPENDENT_AMBULATORY_CARE_PROVIDER_SITE_OTHER): Payer: BC Managed Care – PPO | Admitting: Family Medicine

## 2016-08-23 ENCOUNTER — Encounter: Payer: Self-pay | Admitting: Family Medicine

## 2016-08-23 VITALS — BP 145/65 | HR 79 | Ht 60.0 in | Wt 297.4 lb

## 2016-08-23 DIAGNOSIS — E1121 Type 2 diabetes mellitus with diabetic nephropathy: Secondary | ICD-10-CM

## 2016-08-23 DIAGNOSIS — Z6841 Body Mass Index (BMI) 40.0 and over, adult: Secondary | ICD-10-CM

## 2016-08-23 DIAGNOSIS — I10 Essential (primary) hypertension: Secondary | ICD-10-CM | POA: Diagnosis not present

## 2016-08-23 DIAGNOSIS — R42 Dizziness and giddiness: Secondary | ICD-10-CM | POA: Diagnosis not present

## 2016-08-23 DIAGNOSIS — E1165 Type 2 diabetes mellitus with hyperglycemia: Secondary | ICD-10-CM

## 2016-08-23 DIAGNOSIS — IMO0002 Reserved for concepts with insufficient information to code with codable children: Secondary | ICD-10-CM

## 2016-08-23 NOTE — Assessment & Plan Note (Signed)
Improved 3 lb wt loss in 3 months, still concern overall with severe morbid obesity Need to continue improving lifestyle. Prior declined nutritionist May benefit from referral to [facility]aren [facility]

## 2016-08-23 NOTE — Patient Instructions (Addendum)
Thank you for coming to the clinic today.  1. POC A1c 10.4, elevated - we will re-check with the blood serum test to confirm  If still elevated will send rx for injectable Bydureon  1. Bydureon BCise (Exenatide ER) - once weekly - this is my preference, very good medicine well tolerated, less side effects of nausea, upset stomach. No dose changes. Cost and coverage is the problem, but we may be able to get it with the coupon card  Continue Metformin 1000mg  twice daily as you are  Continue BP medications  I think the dizziness and lightheadedness is likely related to lower BP and possibly higher blood sugar, need to improve hydration  Please schedule a Follow-up Appointment to: Return in about 3 months (around 11/23/2016) for diabetes, blood pressure.  If you have any other questions or concerns, please feel free to call the clinic or send a message through MyChart. You may also schedule an earlier appointment if necessary.  Additionally, you may be receiving a survey about your experience at our clinic within a few days to 1 week by e-mail or mail. We value your feedback.  [name] Minola Guin, DO [facility]

## 2016-08-23 NOTE — Assessment & Plan Note (Signed)
Improved but still mildly elevated BP - Complication with CKD-III and still sub-optimal control DM2  Plan:  1. Continue current BP regimen - Lisinopril-HCTZ 20-25mg  daily, Amlodipine 5mg  daily 2. Encourage improved lifestyle - low sodium diet, improve hydration, start improving regular exercise 3. Continue monitor BP outside office, bring readings to next visit, if persistently >140/90 or new symptoms notify office sooner 4. Follow-up 3 months - plan to re-check BMET next visit trend Cr - if continues to elevate, then consider referral to Nephrology for evaluation

## 2016-08-23 NOTE — Progress Notes (Addendum)
Subjective:    Patient ID: Ashley Terry, female    DOB: 07/22/1965, 51 y.o.   MRN: [id number]  Ashley Terry is a 51 y.o. female presenting on 08/23/2016 for Diabetes (hypertension)  HPI   EPISODES POSTURAL DIZZINESS / LIGHTHEADEDNESS: Reports new complaint now of episodic dizziness/lightheadedness over past few weeks to months, uncertain exact onset, seems more often she will feel only when standing up first thing in morning or during day, sometimes while at work when stands up, also describes feels like she "staggers" a little bit when first walking. Otherwise does not experience constant symptoms and not during exertion or other times if resting. Usually self limited and resolves. - Denies any fall - Denies any exertional chest pain or tightness, dyspnea, syncopal event, headache, vision changes, numbness, weakness, tingling  CHRONIC DM, Type 2: No new concerns. Her A1c had been improving in 8s, she was expecting it to be better today. CBGs: Avg 140-160, Low 120s not < 100, High 189. Checks CBGs x 1 daily AM fasting Meds: Metformin 1000mg  twice a day (increased since last visit 04/2016) - tolerating well Currently on ACEi Lifestyle: - Diet (still trying to improve DM diet, but not always adhering to this, some limited hydration often drinks mostly water at work, but otherwise limited water intake) - Exercise (has not fully resumed regular exercise since post-op) - Previously declined Nutritionist Denies hypoglycemia, polyuria, visual changes, numbness or tingling.  MORBID OBESITY BMI >58 - Weight down 3 lbs since last visit in 3 months. Still limitations to her improved lifestyle, see under DM  CHRONIC HTN: Reports BP improved overall but still slightly elevated in SBP 130-150 range. Mostly 140s. Overall still better than previous 170-180s Current Meds - Lisinopril-HCTZ 20-25mg  (combo pill started last visit), Amlodipine 10mg  daily   Reports good compliance, took meds today. Tolerating  well, w/o complaints. Admits stress at work Denies CP, dyspnea, HA, edema, dizziness / lightheadedness  Review of Systems Per HPI unless specifically indicated above     Objective:    BP (!) 145/65 (BP Location: Right Arm, Patient Position: Sitting, Cuff Size: Large)   Pulse 79   Ht 5' (1.524 m)   Wt 297 lb 6.4 oz (134.9 kg)   BMI 58.08 kg/m   Wt Readings from Last 3 Encounters:  08/23/16 297 lb 6.4 oz (134.9 kg)  05/21/16 300 lb (136.1 kg)  04/23/16 293 lb 6.4 oz (133.1 kg)    Physical Exam  Constitutional: She is oriented to person, place, and time. She appears well-developed and well-nourished. No distress.  Well-appearing, comfortable, cooperative, morbidly obese  HENT:  Head: Normocephalic and atraumatic.  Mouth/Throat: Oropharynx is clear and moist.  Eyes: Conjunctivae are normal.  Neck: Normal range of motion. Neck supple. No thyromegaly present.  Cardiovascular: Normal rate, regular rhythm, normal heart sounds and intact distal pulses.   No murmur heard. Pulmonary/Chest: Effort normal and breath sounds normal. No respiratory distress. She has no wheezes. She has no rales.  Musculoskeletal: Normal range of motion. She exhibits edema (Still stable unchanged bilateral lower extremity +1 pitting edema ankles and lower leg with trace). She exhibits no tenderness.  Distal strength intact  Stand from seated without difficulty  Neurological: She is alert and oriented to person, place, and time.  Distal sensation to light touch intact  Skin: Skin is warm and dry. No rash noted. She is not diaphoretic. No erythema.  Psychiatric: She has a normal mood and affect. Her behavior is normal.  Well groomed, good  eye contact, normal speech and thoughts  Nursing note and vitals reviewed.  No results found for this or any previous visit (from the past 24 hour(s)).   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 Continue current therapy - Metformin 1000mg  BID - previously discussed adding other agent with GLP1 given CKD and hydration status 2. Encourage improved lifestyle - low carb, low sugar diet, reduce portion size, continue improving regular exercise 3. Check CBG , bring log to next visit for review 4. Continue ASA, ACEi, Statin 5. Advised to schedule DM ophtho exam, send record 6. Follow-up 3 months DM A1c      Relevant Orders   Hemoglobin A1c   Morbid obesity with BMI of 50.0-59.9, adult (HCC)    Improved 3 lb wt loss in 3 months, still concern overall with severe morbid obesity Need to continue improving lifestyle. Prior declined nutritionist May benefit from referral to Dr Marian Sorrow Weight [facility]      Essential hypertension    Improved but still mildly elevated BP - Complication with CKD-III and still sub-optimal control DM2  Plan:  1. Continue current BP regimen - Lisinopril-HCTZ 20-25mg  daily, Amlodipine 5mg  daily 2. Encourage improved lifestyle - low sodium diet, improve hydration, start improving regular exercise 3. Continue monitor BP outside office, bring readings to next visit, if persistently >140/90 or new symptoms notify office sooner 4. Follow-up 3 months - plan to re-check BMET next visit trend Cr - if continues to elevate, then consider referral to Nephrology for evaluation       Other Visit Diagnoses    Episode of dizziness      Uncertain exact etiology, seems more related to hydration status, some deconditioning, maybe if A1c is elevated, hyperglycemia could be affecting her. Also BP improved to 130-140s on meds, maybe her body is not used to lower BP since previously long history of SBP >170-180 could be causing  some postural symptoms. - Improve hydration, caution with standing quickly - Improve CBG next - Consider vestibular rehab or gait PT to see if other factors - May return to Cardiology in future if other symptoms or not improved - Reviewed return criteria red flags, when to seek more emergent therapy at hospital ED      No orders of the defined types were placed in this encounter.    Follow up plan: Return in about 3 months (around 11/23/2016) for diabetes, blood pressure.   **UPDATE Diabetes - A1c serum resulted 10.3, and patient agree to start Verline Lema, new rx sent to pharmacy today 08/27/16**  Saralyn Pilar, DO [facility] 08/23/2016, 5:51 PM

## 2016-08-23 NOTE — Assessment & Plan Note (Signed)
Remains uncontrolled DM with A1c >8, today reading POC 10.4, however concern this may be inaccurate based on home readings, agree will re-check serum A1c today for comparison Complications - CKD-III  Plan:  1. Continue current therapy - Metformin 1000mg  BID - previously discussed adding other agent with GLP1 given CKD and hydration status 2. Encourage improved lifestyle - low carb, low sugar diet, reduce portion size, continue improving regular exercise 3. Check CBG , bring log to next visit for review 4. Continue ASA, ACEi, Statin 5. Advised to schedule DM ophtho exam, send record 6. Follow-up 3 months DM A1c

## 2016-08-24 LAB — HEMOGLOBIN A1C
HEMOGLOBIN A1C: 10.3 % — AB (ref <5.7)
MEAN PLASMA GLUCOSE: 249 mg/dL

## 2016-08-27 MED ORDER — EXENATIDE ER 2 MG/0.85ML ~~LOC~~ AUIJ: 2.0000 mg | AUTO-INJECTOR | SUBCUTANEOUS | 5 refills | Status: DC

## 2016-08-27 NOTE — Addendum Note (Signed)
Addended by: Smitty CordsKARAMALEGOS, Rosaire Cueto J on: 08/27/2016 12:19 PM   Modules accepted: Orders

## 2016-08-30 ENCOUNTER — Telehealth: Payer: Self-pay | Admitting: Family Medicine

## 2016-08-30 DIAGNOSIS — IMO0002 Reserved for concepts with insufficient information to code with codable children: Secondary | ICD-10-CM

## 2016-08-30 DIAGNOSIS — E1165 Type 2 diabetes mellitus with hyperglycemia: Principal | ICD-10-CM

## 2016-08-30 DIAGNOSIS — E1121 Type 2 diabetes mellitus with diabetic nephropathy: Secondary | ICD-10-CM

## 2016-08-30 MED ORDER — DULAGLUTIDE 0.75 MG/0.5ML ~~LOC~~ SOAJ: 0.7500 mg | SUBCUTANEOUS | 5 refills | Status: DC

## 2016-08-30 NOTE — Telephone Encounter (Signed)
[name] and preference for alternative rx either Victoza or Trulicity. Will DC Bydureon and send new rx Trulicity to pharmacy.  Please notify patient of this change, same instructions for medication, and she may come by our office to pick up a co-pay card for Trulicity.  [name] [name], DO [facility] 08/30/2016, 9:06 AM

## 2016-08-30 NOTE — Telephone Encounter (Signed)
No vm set up. 

## 2016-09-03 NOTE — Telephone Encounter (Signed)
Called several time no voicemail set a letter is being mailed.

## 2016-11-21 ENCOUNTER — Other Ambulatory Visit: Payer: Self-pay | Admitting: Family Medicine

## 2016-11-21 DIAGNOSIS — N183 Chronic kidney disease, stage 3 (moderate): Principal | ICD-10-CM

## 2016-11-21 DIAGNOSIS — E1122 Type 2 diabetes mellitus with diabetic chronic kidney disease: Secondary | ICD-10-CM

## 2016-11-25 ENCOUNTER — Other Ambulatory Visit: Payer: Self-pay | Admitting: Family Medicine

## 2016-11-25 ENCOUNTER — Encounter: Payer: Self-pay | Admitting: Family Medicine

## 2016-11-25 ENCOUNTER — Ambulatory Visit (INDEPENDENT_AMBULATORY_CARE_PROVIDER_SITE_OTHER): Payer: BC Managed Care – PPO | Admitting: Family Medicine

## 2016-11-25 VITALS — BP 128/73 | HR 81 | Temp 98.6°F | Resp 16 | Ht 60.0 in | Wt 296.0 lb

## 2016-11-25 DIAGNOSIS — Z0000 Encounter for general adult medical examination without abnormal findings: Secondary | ICD-10-CM

## 2016-11-25 DIAGNOSIS — Z23 Encounter for immunization: Secondary | ICD-10-CM | POA: Diagnosis not present

## 2016-11-25 DIAGNOSIS — E1169 Type 2 diabetes mellitus with other specified complication: Secondary | ICD-10-CM

## 2016-11-25 DIAGNOSIS — I10 Essential (primary) hypertension: Secondary | ICD-10-CM

## 2016-11-25 DIAGNOSIS — M1712 Unilateral primary osteoarthritis, left knee: Secondary | ICD-10-CM | POA: Diagnosis not present

## 2016-11-25 DIAGNOSIS — E785 Hyperlipidemia, unspecified: Secondary | ICD-10-CM

## 2016-11-25 DIAGNOSIS — E1140 Type 2 diabetes mellitus with diabetic neuropathy, unspecified: Secondary | ICD-10-CM

## 2016-11-25 DIAGNOSIS — R7989 Other specified abnormal findings of blood chemistry: Secondary | ICD-10-CM

## 2016-11-25 DIAGNOSIS — Z6841 Body Mass Index (BMI) 40.0 and over, adult: Secondary | ICD-10-CM

## 2016-11-25 DIAGNOSIS — D649 Anemia, unspecified: Secondary | ICD-10-CM | POA: Insufficient documentation

## 2016-11-25 DIAGNOSIS — Z1159 Encounter for screening for other viral diseases: Secondary | ICD-10-CM

## 2016-11-25 DIAGNOSIS — D509 Iron deficiency anemia, unspecified: Secondary | ICD-10-CM

## 2016-11-25 LAB — POCT GLYCOSYLATED HEMOGLOBIN (HGB A1C): HEMOGLOBIN A1C: 7.6 — AB (ref ?–5.7)

## 2016-11-25 MED ORDER — DULAGLUTIDE 1.5 MG/0.5ML ~~LOC~~ SOAJ: 1.5000 mg | SUBCUTANEOUS | 11 refills | Status: DC

## 2016-11-25 MED ORDER — MEGARED OMEGA-3 KRILL OIL 500 MG PO CAPS: 500.0000 mg | ORAL_CAPSULE | Freq: Every day | ORAL | Status: DC

## 2016-11-25 NOTE — Progress Notes (Signed)
Subjective:    Patient ID: Ashley Terry, female    DOB: 1965-10-19, 51 y.o.   MRN: [id number]  Li Bobo is a 51 y.o. female presenting on 11/25/2016 for Diabetes   HPI   CHRONIC DM, Type 2: No new concerns. Her A1c is notably improved and she is pleased. CBGs: Avg 120-140, Low >100, High < 170. Checks CBGs x 1 daily AM fasting Meds: Metformin  twice a day, Trulicity 0.75mg  injectable weekly - overall feels better now, initially 1st month had some upset stomach and nausea, and local soreness - Exenatide (Bydureon) was not covered by insurance, and this med was switched from last visit Currently on ACEi Lifestyle: - Diet (Dramatic improved diet, now following nutrition advice on YouTube, fruit in AM, lunch has carb + vegetable, dinner protein and vegetable, still modified portions, and has avoided certain foods that were making her more sick, reducing carb, drinking more water) - Exercise (still has not improved to regular exercise, previously has fallen few times with L knee arthritis and instability, she did urgent walk in and was sent to ED had [place] ruled out clot, does not have ortho) - Due for DM Eye exam, she has re-scheduled and plans to keep this apt Denies hypoglycemia, polyuria, visual changes, numbness or tingling.  Left Knee Arthritis - Interval update from last visit, she has history of falls and imbalance, she was seen at Urgent [facility] through [facility], and had eval and X-rays of knees, including sent to ED for concern potential DVT due to pain, but not in calf, she had negative doppler US (reviewed in [facility] 09/07/16, negative LLE) - She is taking Tylenol Arthritis PRN - Considered Knee brace but has not obtained one yet - States this limits her exercise  CHRONIC HTN:  Reports BP improved still overall, she does not trust home cuff readings 138-141, thinks it doesn't "tighten" enough. She may bring in to calibrate it Current Meds - Lisinopril-HCTZ 20-25mg ,  Amlodipine  daily   Reports good compliance, took meds today. Tolerating well, w/o complaints. Denies CP, dyspnea, HA, edema, dizziness / lightheadedness  Health Maintenance: - Colon CA Screening: Never had colonoscopy, now age 52, due for screening, interested in cologuard, will check ins coverage first. No symptoms and no family history of colon CA - Due for Flu Shot today - Due for Pneumonia Vaccine - Pneumovax-23 (as diabetic), then next due at age 50 prevnar61 - Due for Mammogram, gets this done through [facility], does not need any new order today  Depression screen [facility] 2/9 11/25/2016 01/05/2016  Decreased Interest 0 1  Down, Depressed, Hopeless 0 0  PHQ - 2 Score 0 1    Social History  Substance Use Topics  . Smoking status: Never Smoker  . Smokeless tobacco: Never Used  . Alcohol use No    Review of Systems Per HPI unless specifically indicated above     Objective:    BP 128/73   Pulse 81   Temp 98.6 F (37 C) (Oral)   Resp 16   Ht 5' (1.524 m)   Wt 296 lb (134.3 kg)   BMI 57.81 kg/m   Wt Readings from Last 3 Encounters:  11/25/16 296 lb (134.3 kg)  08/23/16 297 lb 6.4 oz (134.9 kg)  05/21/16 300 lb (136.1 kg)    Physical Exam  Constitutional: She is oriented to person, place, and time. She appears well-developed and well-nourished. No distress.  Well-appearing, comfortable, cooperative, morbidly obese, slight weight loss  HENT:  Head: Normocephalic and atraumatic.  Mouth/Throat: Oropharynx is clear and moist.  Eyes: Conjunctivae are normal. Right eye exhibits no discharge. Left eye exhibits no discharge.  Neck: Normal range of motion. Neck supple.  Cardiovascular: Normal rate, regular rhythm, normal heart sounds and intact distal pulses.   No murmur heard. Pulmonary/Chest: Effort normal and breath sounds normal. No respiratory distress. She has no wheezes. She has no rales.  Musculoskeletal: Normal range of motion. She exhibits no edema.  Neurological: She  is alert and oriented to person, place, and time.  Skin: Skin is warm and dry. No rash noted. She is not diaphoretic. No erythema.  Psychiatric: She has a normal mood and affect. Her behavior is normal.  Well groomed, good eye contact, normal speech and thoughts  Nursing note and vitals reviewed.   Recent Labs  05/21/16 0911 08/23/16 1141 11/25/16 1108  HGBA1C 8.2 10.3* 7.6*    Results for orders placed or performed in visit on 11/25/16  POCT HgB A1C  Result Value Ref Range   Hemoglobin A1C 7.6 (A) 5.7      Assessment & Plan:   Problem List Items Addressed This Visit    Controlled type 2 diabetes with neuropathy (HCC) - Primary    Dramatic improvement in DM control A1c 10.3 to 7.6 with recent addition of GLP1 and improved diet. Still limited exercise due to obesity and L knee OA/instability Complications - CKD-III  Plan:  1. Increase GLP1 - Trulicity 0.75 to 1.5mg  weekly inj (Sunday) - reviewed potential risk/side effects, had been tolerating well after 1st month - Continue current therapy - Metformin  BID 2. Encourage improved lifestyle - low carb, low sugar diet, reduce portion size, continue improving regular exercise 3. Check CBG , bring log to next visit for review 4. Continue ASA, ACEi, Statin 5. Advised to schedule DM ophtho exam, send record - again reviewed this 6. Follow-up 3 months Annual Phys + labs/A1c      Relevant Medications   Dulaglutide (TRULICITY) 1.5 MG/0.5ML SOPN   Other Relevant Orders   POCT HgB A1C (Completed)   Essential hypertension    Dramatic improvement today, normal range BP,  Home reading still mild elevated, unsure if accurate need to calibrate - Complication with CKD-III  Plan:  1. Continue current BP regimen - Lisinopril-HCTZ 20-25mg  daily, Amlodipine  daily 2. Encourage improved lifestyle - low sodium diet, improve hydration, start improving regular exercise as tolerated, needs knee brace for L knee and future may need  Ortho 3. Continue monitor BP outside office, bring readings to next visit, if persistently >140/90 or new symptoms notify office sooner - bring cuff to office to calibrate or get new cuff 4. Follow-up 3 months - Annual Physical + labs / if Cr trend increasing consider referral to Nephrology for evaluation      Osteoarthritis of left knee    Subacute on chronic L generalized Knee pain with chronic soft tissue swelling without known injury or trauma - History of falls and unsteady gait due to instability of joint - Less concern for meniscus injury at this time, but not focus of visit now - Able to bear weight, no mechanical locking - No prior history of knee surgery, arthroscopy - Inadequate conservative therapy  Plan: 1. Increase regular Tylenol dosing, recommend knee brace for support PRN 2. RICE therapy (rest, ice, compression, elevation) for swelling, activity modification 3. Had X-rays and imaging at [facility] - defer repeat for now 4. Follow-up as needed may need referral in  future to Ortho vs formal PT vs injection       Other Visit Diagnoses    Needs flu shot       Relevant Orders   Flu Vaccine QUAD 36+ mos IM (Completed)   Need for 23-polyvalent pneumococcal polysaccharide vaccine       Relevant Orders   Pneumococcal polysaccharide vaccine 23-valent greater than or equal to 2yo subcutaneous/IM (Completed)      Meds ordered this encounter  Medications  . Dulaglutide (TRULICITY) 1.5 MG/0.5ML SOPN    Sig: Inject 1.5 mg into the skin once a week.    Dispense:  4 pen    Refill:  11  . MEGARED OMEGA-3 KRILL OIL 500 MG CAPS    Sig: Take 500 mg by mouth daily.    Follow up plan: Return in about 3 months (around 02/25/2017) for Annual Physical.  Saralyn Pilar, DO [facility] 11/25/2016, 1:26 PM

## 2016-11-25 NOTE — Assessment & Plan Note (Signed)
Dramatic improvement today, normal range BP,  Home reading still mild elevated, unsure if accurate need to calibrate - Complication with CKD-III  Plan:  1. Continue current BP regimen - Lisinopril-HCTZ 20-25mg  daily, Amlodipine  daily 2. Encourage improved lifestyle - low sodium diet, improve hydration, start improving regular exercise as tolerated, needs knee brace for L knee and future may need Ortho 3. Continue monitor BP outside office, bring readings to next visit, if persistently >140/90 or new symptoms notify office sooner - bring cuff to office to calibrate or get new cuff 4. Follow-up 3 months - Annual Physical + labs / if Cr trend increasing consider referral to Nephrology for evaluation

## 2016-11-25 NOTE — Patient Instructions (Addendum)
Thank you for coming to the clinic today.  1. Increased Trulicity from 2.39 to 5.3UY weekly, may have some nausea and upset stomach again, always take with food, it should improve gradually same as before  2. Gradually wean down on Protonix 72m - take one EVERY OTHER day, for 2-4 weeks, then may space it out to 2-3 x a week ONLY, or can stop completely. Caution rebound symptoms.  Future Mammogram, when ready  Future Diabetic Eye Exam - [place]  3. For Left Knee  Recommend to start taking Tylenol Extra Strength 500 or Arthritis 654mtabs - take 1 to 2 tabs per dose (max 1000 to 1300109mevery 6-8 hours for pain (take regularly, don't skip a dose for next 7 days), max 24 hour daily dose is 6 tablets or 3000m52mn the future you can repeat the same everyday Tylenol course for 1-2 weeks at a time.   Colon Cancer Screening: - For all adults age 53+ 47+tine colon cancer screening is highly recommended.     - Recent guidelines from [place] starting age of 45 -50arly detection of colon cancer is important, because often there are no warning signs or symptoms, also if found early usually it can be cured. Late stage is hard to treat.  - If you are not interested in Colonoscopy screening (if done and normal you could be cleared for 5 to 10 years until next due), then Cologuard is an excellent alternative for screening test for Colon Cancer. It is highly sensitive for detecting DNA of colon cancer from even the earliest stages. Also, there is NO bowel prep required. - If Cologuard is NEGATIVE, then it is good for 3 years before next due - If Cologuard is POSITIVE, then it is strongly advised to get a Colonoscopy, which allows the GI doctor to locate the source of the cancer or polyp (even very early stage) and treat it by removing it. ------------------------- If you would like to proceed with Cologuard (stool DNA test) - FIRST, call your insurance company and  tell them you want to check cost of Cologuard tell them CPT Code [phone] may be completely covered and you could get for no cost, OR max cost without any coverage is about $600). Also, keep in mind if you do NOT open the kit, and decide not to do the test, you will NOT be charged, you should contact the company if you decide not to do the test. - If you want to proceed, you can notify us ([place] message, [place] at next visit) and we will order it for you. The test kit will be delivered to you house within about 1 week. Follow instructions to collect sample, you may call the company for any help or questions, 24/7 telephone support at [phone] schedule a Follow-up Appointment to: Return in about 3 months (around 02/25/2017) for Annual Physical.  If you have any other questions or concerns, please feel free to call the clinic or send a message through [facility] may also schedule an earlier appointment if necessary.  Additionally, you may be receiving a survey about your experience at our clinic within a few days to 1 week by e-mail or mail. We value your feedback.  AlexNobie [place]

## 2016-11-25 NOTE — Assessment & Plan Note (Signed)
Subacute on chronic L generalized Knee pain with chronic soft tissue swelling without known injury or trauma - History of falls and unsteady gait due to instability of joint - Less concern for meniscus injury at this time, but not focus of visit now - Able to bear weight, no mechanical locking - No prior history of knee surgery, arthroscopy - Inadequate conservative therapy  Plan: 1. Increase regular Tylenol dosing, recommend knee brace for support PRN 2. RICE therapy (rest, ice, compression, elevation) for swelling, activity modification 3. Had X-rays and imaging at [facility] - defer repeat for now 4. Follow-up as needed may need referral in future to Ortho vs formal PT vs injection

## 2016-11-25 NOTE — Assessment & Plan Note (Signed)
Dramatic improvement in DM control A1c 10.3 to 7.6 with recent addition of GLP1 and improved diet. Still limited exercise due to obesity and L knee OA/instability Complications - CKD-III  Plan:  1. Increase GLP1 - Trulicity 0.75 to 1.5mg  weekly inj (Sunday) - reviewed potential risk/side effects, had been tolerating well after 1st month - Continue current therapy - Metformin  BID 2. Encourage improved lifestyle - low carb, low sugar diet, reduce portion size, continue improving regular exercise 3. Check CBG , bring log to next visit for review 4. Continue ASA, ACEi, Statin 5. Advised to schedule DM ophtho exam, send record - again reviewed this 6. Follow-up 3 months Annual Phys + labs/A1c

## 2017-02-14 ENCOUNTER — Other Ambulatory Visit: Payer: Self-pay | Admitting: Family Medicine

## 2017-02-14 DIAGNOSIS — E1165 Type 2 diabetes mellitus with hyperglycemia: Principal | ICD-10-CM

## 2017-02-14 DIAGNOSIS — IMO0002 Reserved for concepts with insufficient information to code with codable children: Secondary | ICD-10-CM

## 2017-02-14 DIAGNOSIS — E1121 Type 2 diabetes mellitus with diabetic nephropathy: Secondary | ICD-10-CM

## 2017-03-14 ENCOUNTER — Other Ambulatory Visit: Payer: Self-pay

## 2017-03-14 ENCOUNTER — Other Ambulatory Visit: Payer: BC Managed Care – PPO

## 2017-03-14 DIAGNOSIS — I10 Essential (primary) hypertension: Secondary | ICD-10-CM

## 2017-03-14 DIAGNOSIS — Z0000 Encounter for general adult medical examination without abnormal findings: Secondary | ICD-10-CM

## 2017-03-14 DIAGNOSIS — E785 Hyperlipidemia, unspecified: Secondary | ICD-10-CM

## 2017-03-14 DIAGNOSIS — E1169 Type 2 diabetes mellitus with other specified complication: Secondary | ICD-10-CM

## 2017-03-14 DIAGNOSIS — D509 Iron deficiency anemia, unspecified: Secondary | ICD-10-CM

## 2017-03-14 DIAGNOSIS — R7989 Other specified abnormal findings of blood chemistry: Secondary | ICD-10-CM

## 2017-03-14 DIAGNOSIS — E1140 Type 2 diabetes mellitus with diabetic neuropathy, unspecified: Secondary | ICD-10-CM

## 2017-03-14 DIAGNOSIS — Z1159 Encounter for screening for other viral diseases: Secondary | ICD-10-CM

## 2017-03-17 ENCOUNTER — Other Ambulatory Visit: Payer: BC Managed Care – PPO

## 2017-03-18 LAB — COMPLETE METABOLIC PANEL WITHOUT GFR
AG Ratio: 0.9 (calc) — ABNORMAL LOW (ref 1.0–2.5)
ALT: 11 U/L (ref 6–29)
AST: 11 U/L (ref 10–35)
Albumin: 3.7 g/dL (ref 3.6–5.1)
Alkaline phosphatase (APISO): 108 U/L (ref 33–130)
BUN/Creatinine Ratio: 15 (calc) (ref 6–22)
BUN: 25 mg/dL (ref 7–25)
CO2: 27 mmol/L (ref 20–32)
Calcium: 9.5 mg/dL (ref 8.6–10.4)
Chloride: 105 mmol/L (ref 98–110)
Creat: 1.68 mg/dL — ABNORMAL HIGH (ref 0.50–1.05)
GFR, Est African American: 40 mL/min/{1.73_m2} — ABNORMAL LOW
GFR, Est Non African American: 34 mL/min/{1.73_m2} — ABNORMAL LOW
Globulin: 3.9 g/dL — ABNORMAL HIGH (ref 1.9–3.7)
Glucose, Bld: 133 mg/dL — ABNORMAL HIGH (ref 65–99)
Potassium: 4.5 mmol/L (ref 3.5–5.3)
Sodium: 140 mmol/L (ref 135–146)
Total Bilirubin: 0.2 mg/dL (ref 0.2–1.2)
Total Protein: 7.6 g/dL (ref 6.1–8.1)

## 2017-03-18 LAB — LIPID PANEL
CHOL/HDL RATIO: 4.6 (calc) (ref <5.0)
Cholesterol: 143 mg/dL (ref <200)
HDL: 31 mg/dL — ABNORMAL LOW (ref >50)
LDL CHOLESTEROL (CALC): 89 mg/dL
Non-HDL Cholesterol (Calc): 112 mg/dL (calc) (ref <130)
Triglycerides: 132 mg/dL (ref <150)

## 2017-03-18 LAB — CBC WITH DIFFERENTIAL/PLATELET
BASOS ABS: 49 {cells}/uL (ref 0–200)
Basophils Relative: 0.5 %
EOS ABS: 349 {cells}/uL (ref 15–500)
Eosinophils Relative: 3.6 %
HEMATOCRIT: 34.3 % — AB (ref 35.0–45.0)
Hemoglobin: 10.8 g/dL — ABNORMAL LOW (ref 11.7–15.5)
Lymphs Abs: 2086 cells/uL (ref 850–3900)
MCH: 26.9 pg — AB (ref 27.0–33.0)
MCHC: 31.5 g/dL — AB (ref 32.0–36.0)
MCV: 85.5 fL (ref 80.0–100.0)
MPV: 11.3 fL (ref 7.5–12.5)
Monocytes Relative: 6.6 %
NEUTROS PCT: 67.8 %
Neutro Abs: 6577 cells/uL (ref 1500–7800)
Platelets: 394 10*3/uL (ref 140–400)
RBC: 4.01 10*6/uL (ref 3.80–5.10)
RDW: 13 % (ref 11.0–15.0)
Total Lymphocyte: 21.5 %
WBC: 9.7 10*3/uL (ref 3.8–10.8)
WBCMIX: 640 {cells}/uL (ref 200–950)

## 2017-03-18 LAB — HEPATITIS C ANTIBODY
Hepatitis C Ab: NONREACTIVE
SIGNAL TO CUT-OFF: 0.03 (ref <1.00)

## 2017-03-18 LAB — HEMOGLOBIN A1C
EAG (MMOL/L): 7.1 (calc)
HEMOGLOBIN A1C: 6.1 %{Hb} — AB (ref <5.7)
MEAN PLASMA GLUCOSE: 128 (calc)

## 2017-03-21 ENCOUNTER — Encounter: Payer: BC Managed Care – PPO | Admitting: Family Medicine

## 2017-03-24 ENCOUNTER — Other Ambulatory Visit: Payer: Self-pay | Admitting: Family Medicine

## 2017-03-24 ENCOUNTER — Encounter: Payer: Self-pay | Admitting: Family Medicine

## 2017-03-24 ENCOUNTER — Ambulatory Visit (INDEPENDENT_AMBULATORY_CARE_PROVIDER_SITE_OTHER): Payer: BC Managed Care – PPO | Admitting: Family Medicine

## 2017-03-24 VITALS — BP 125/65 | HR 70 | Temp 98.2°F | Resp 16 | Ht 60.0 in | Wt 289.0 lb

## 2017-03-24 DIAGNOSIS — M25562 Pain in left knee: Secondary | ICD-10-CM | POA: Diagnosis not present

## 2017-03-24 DIAGNOSIS — N183 Chronic kidney disease, stage 3 unspecified: Secondary | ICD-10-CM

## 2017-03-24 DIAGNOSIS — E1140 Type 2 diabetes mellitus with diabetic neuropathy, unspecified: Secondary | ICD-10-CM

## 2017-03-24 DIAGNOSIS — I129 Hypertensive chronic kidney disease with stage 1 through stage 4 chronic kidney disease, or unspecified chronic kidney disease: Secondary | ICD-10-CM | POA: Diagnosis not present

## 2017-03-24 DIAGNOSIS — M1712 Unilateral primary osteoarthritis, left knee: Secondary | ICD-10-CM

## 2017-03-24 DIAGNOSIS — G8929 Other chronic pain: Secondary | ICD-10-CM | POA: Diagnosis not present

## 2017-03-24 DIAGNOSIS — Z6841 Body Mass Index (BMI) 40.0 and over, adult: Secondary | ICD-10-CM | POA: Diagnosis not present

## 2017-03-24 DIAGNOSIS — Z0001 Encounter for general adult medical examination with abnormal findings: Secondary | ICD-10-CM

## 2017-03-24 DIAGNOSIS — Z124 Encounter for screening for malignant neoplasm of cervix: Secondary | ICD-10-CM

## 2017-03-24 DIAGNOSIS — K219 Gastro-esophageal reflux disease without esophagitis: Secondary | ICD-10-CM

## 2017-03-24 DIAGNOSIS — I10 Essential (primary) hypertension: Secondary | ICD-10-CM

## 2017-03-24 DIAGNOSIS — Z0000 Encounter for general adult medical examination without abnormal findings: Secondary | ICD-10-CM

## 2017-03-24 DIAGNOSIS — Z1239 Encounter for other screening for malignant neoplasm of breast: Secondary | ICD-10-CM

## 2017-03-24 MED ORDER — LISINOPRIL-HYDROCHLOROTHIAZIDE 20-25 MG PO TABS: 1.0000 | ORAL_TABLET | Freq: Every day | ORAL | 3 refills | Status: DC

## 2017-03-24 MED ORDER — DICLOFENAC SODIUM 1 % TD GEL: 2.0000 g | Freq: Three times a day (TID) | TRANSDERMAL | 3 refills | Status: DC | PRN

## 2017-03-24 NOTE — Assessment & Plan Note (Signed)
Well controlled, still improved Home reading improved - Complication with CKD-III  Plan:  1. Continue current BP regimen - Lisinopril-HCTZ 20-25mg  daily, Amlodipine 5mg  daily 2. Encourage improved lifestyle - low sodium diet, improve hydration, start improving regular exercise as tolerated - now refer to Orthopedics for L knee limiting her 3. Continue monitor BP outside office, bring readings to next visit, if persistently >140/90 or new symptoms notify office sooner 4. Follow-up 3 months + labs / if Cr trend increasing consider referral to Nephrology for evaluation - again reviewed this option

## 2017-03-24 NOTE — Assessment & Plan Note (Signed)
Still significantly improved now well controlled DM control A1c 6.1 from 7.6 (in past >10) Improved lifestyle. Still limited exercise due to obesity and L knee OA/instability Complications - CKD-III  Plan:  1. Continue current meds - Metformin 1000mg  BID, Trulicity 1.5mg  weekly inj (tolerating well only brief period side effect nausea 1 hour) - no change 2. Encourage improved lifestyle - low carb, low sugar diet, reduce portion size, continue improving regular exercise 3. Check CBG , bring log to next visit for review 4. Continue ASA, ACEi, Statin 5. Advised to schedule DM ophtho exam, send record - again reviewed this - [facility] 6. Follow-up 3 months DM + labs/A1c  Also discussed Creatinine, will recheck if elevated may need Nephrology, likely related to DM, HTN, Age

## 2017-03-24 NOTE — Assessment & Plan Note (Signed)
Gradual improvement wt loss down 7 lbs in 4 months Improved diet and medicine changes now on max dose GLP1 Limited exercise still gradually improving home exercise, less cardio due to knee Defer referral to Weight Management Continue encourage improve diet / exercise as planned, continue goal wt loss 1 lb weekly for now

## 2017-03-24 NOTE — Progress Notes (Signed)
Subjective:    Patient ID: Ashley Terry, female    DOB: [birth year], 52 y.o.   MRN: [id number]  Ashley Terry is a 52 y.o. female presenting on 03/24/2017 for Annual Exam   HPI  Here for Annual Physical and Lab Review  CHRONIC DM, Type 2: No new concerns. Pleased with improved A1c CBGs: Avg 120, Low >100, High < 150. Checks CBGs x 1 daily AM fasting Meds: Metformin 1000mg  twice a day, Trulicity 1.5mg  injectable weekly (still has occasional "feels different" with slight nausea up to 1 hour or so, then improves) Currently on ACEi - She can re-schedule [facility] exam - she keratoconus, within next few months Denies hypoglycemia  CKD-III - Interval history, with several lab draws with persistently elevated Creatinine previously Cr 1.2 to 1.5, recently has had readings Cr 1.58 now last was 1.68 - She reports that she has improved hydration, even before lab work, and also no longer taking NSAIDs as of last year when previously discussed, has been off NSAIDs for >6 months or more - She has not seen Nephrologist  MORBID OBESITY BMI >56 Interval history with down 7 lbs in 4 months with lifestyle and med changes Lifestyle: - Diet (Still difficulty with nausea and episodes after her surgery last year, gradual improvement initially was daily, then 3-4 days week, then now down to 1 x day a week, certain trigger foods coffee and milk chocolate, spicy foods, slowly learning list of her trigger foods) - Exercise (She has purchased some weights and resistance bands, and working on this at home)  HYPERLIPIDEMIA: - Reports no concerns. Last lipid panel 02/2017, significantly improved with reduced TC, LDL, TG now controlled - Currently taking Atorvastatin 40mg , tolerating well without side effects or myalgias  GERD / Biliary Colic / S/p cholecystectomy Reports still has problem with some chronic nausea related to certain trigger foods in general will have some nausea, less pain and discomfort. She was  able to titrate down on pantoprazole down to intermittent use now, not daily. See above diet.  FOLLOW-UP Left Knee Arthritis / Chronic Pain - Reports chronic problem still with Left Knee pain, she has been seen by [facility] in past, had some X-rays and Ultrasound negative for bakers cyst, do not see x-ray results. - She admits some worsening with occasional knee pop and pain, and sometimes feels like it will "lock up" on her, may cause her to fall but she has not fallen yet on it recently. She has some stiffness and soreness at first but then improved if "gets going" and with more activity, although it is more sore then later - She is requesting referral to Orthopedics - Taking Tylenol PRN limited relief - Cannot take NSAIDs  CHRONIC HTN:  BP Improved at home now on cuff Current Meds - Lisinopril-HCTZ 20-25mg , Amlodipine 10mg  daily - Ran out of Lisinopril at one point for 4-6 weeks but now has it back Reports good compliance, took meds today. Tolerating well, w/o complaints.   Health Maintenance: - Colon CA Screening: Never had colonoscopy, now age 74, due for screening, interested in cologuard, will check ins coverage first. No symptoms and no family history of colon. She is still interested in Cologuard - waiting to contact insurance coverage first, then will notify office if ready for rx  - Due for Mammogram, previously done through [facility], now agrees to go locally to [facility] - new order placed today, and fax record released signed and faxed to [place], handout  given patient to call to schedule mammo  UTD Flu and PNA vaccines  - Due routine pap smear - requests referral to GYN - [facility] to [facility]  Depression screen [facility] 2/9 03/24/2017 11/25/2016 01/05/2016  Decreased Interest 0 0 1  Down, Depressed, Hopeless 0 0 0  PHQ - 2 Score 0 0 1    Past Medical History:  Diagnosis Date  . Asthma   . Diabetes (HCC)   . Dyspnea    with exertion  . Frequent headaches   . GERD  (gastroesophageal reflux disease)    takes protonix  . High blood pressure   . Seasonal allergies   . Sleep apnea    CPAP   Past Surgical History:  Procedure Laterality Date  . CESAREAN SECTION  1980  . CESAREAN SECTION  1992  . CHOLECYSTECTOMY N/A 04/16/2016   Procedure: LAPAROSCOPIC CHOLECYSTECTOMY;  Surgeon: Lattie Haw, MD;  Location: [facility] ORS;  Service: General;  Laterality: N/A;  . TONSILECTOMY, ADENOIDECTOMY, BILATERAL MYRINGOTOMY AND TUBES  1980  . TUBAL LIGATION  1994   Social History   Socioeconomic History  . Marital status: Widowed    Spouse name: Not on file  . Number of children: Not on file  . Years of education: Not on file  . Highest education level: Not on file  Social Needs  . Financial resource strain: Not on file  . Food insecurity - worry: Not on file  . Food insecurity - inability: Not on file  . Transportation needs - medical: Not on file  . Transportation needs - non-medical: Not on file  Occupational History  . Occupation: Materials engineer)  Tobacco Use  . Smoking status: Never Smoker  . Smokeless tobacco: Never Used  Substance and Sexual Activity  . Alcohol use: No  . Drug use: No  . Sexual activity: No  Other Topics Concern  . Not on file  Social History Narrative  . Not on file   Family History  Problem Relation Age of Onset  . Diabetes Mother   . Hypertension Mother   . Stroke Father   . Cancer Father        Amyloid  . Pancreatic cancer Maternal Grandmother   . Breast cancer Paternal Grandmother   . Prostate cancer Maternal Grandfather   . Cancer Paternal Grandfather        unknown   Current Outpatient Medications on File Prior to Visit  Medication Sig  . acetaminophen (TYLENOL) 500 MG tablet Take 500 mg by mouth every 6 (six) hours as needed for moderate pain.  Marland Kitchen amLODipine (NORVASC) 10 MG tablet TAKE 1 TABLET (10 MG TOTAL) BY MOUTH DAILY.  Marland Kitchen aspirin EC 81 MG tablet Take 81 mg by mouth daily.  Marland Kitchen  aspirin-acetaminophen-caffeine (EXCEDRIN MIGRAINE) 250-250-65 MG tablet Take 2 tablets by mouth every 6 (six) hours as needed for headache or migraine.  Marland Kitchen atorvastatin (LIPITOR) 40 MG tablet TAKE 1 TABLET (40 MG TOTAL) BY MOUTH AT BEDTIME.  . Dulaglutide (TRULICITY) 1.5 MG/0.5ML SOPN Inject 1.5 mg into the skin once a week.  Marland Kitchen lisinopril-hydrochlorothiazide (PRINZIDE,ZESTORETIC) 20-25 MG tablet Take 1 tablet by mouth daily.  Marland Kitchen MEGARED OMEGA-3 KRILL OIL 500 MG CAPS Take 500 mg by mouth daily.  . metFORMIN (GLUCOPHAGE) 1000 MG tablet TAKE 1 TABLET (1,000 MG TOTAL) BY MOUTH 2 (TWO) TIMES DAILY WITH A MEAL.  . pantoprazole (PROTONIX) 40 MG tablet TAKE 1 TABLET (40 MG TOTAL) BY MOUTH DAILY. 30 MIN BEFORE FIRST MEAL OF  DAY   No current facility-administered medications on file prior to visit.     Review of Systems  Constitutional: Negative for activity change, appetite change, chills, diaphoresis, fatigue and fever.  HENT: Negative for congestion, hearing loss and sinus pressure.   Eyes: Negative for visual disturbance.  Respiratory: Negative for apnea, cough, choking, chest tightness, shortness of breath and wheezing.   Cardiovascular: Negative for chest pain, palpitations and leg swelling.  Gastrointestinal: Positive for nausea (none actively, has intermittent flares). Negative for abdominal pain, anal bleeding, blood in stool, constipation, diarrhea and vomiting.  Endocrine: Negative for cold intolerance.  Genitourinary: Negative for decreased urine volume, difficulty urinating, dysuria, frequency, hematuria and urgency.  Musculoskeletal: Positive for arthralgias (L knee). Negative for back pain and neck pain.  Skin: Negative for rash.  Allergic/Immunologic: Negative for environmental allergies.  Neurological: Negative for dizziness, weakness, light-headedness, numbness and headaches.  Hematological: Negative for adenopathy.  Psychiatric/Behavioral: Negative for behavioral problems, dysphoric  mood and sleep disturbance. The patient is not nervous/anxious.    Per HPI unless specifically indicated above     Objective:    BP 125/65   Pulse 70   Temp 98.2 F (36.8 C) (Oral)   Resp 16   Ht 5' (1.524 m)   Wt 289 lb (131.1 kg)   BMI 56.44 kg/m   Wt Readings from Last 3 Encounters:  03/24/17 289 lb (131.1 kg)  11/25/16 296 lb (134.3 kg)  08/23/16 297 lb 6.4 oz (134.9 kg)    Physical Exam  Constitutional: She is oriented to person, place, and time. She appears well-developed and well-nourished. No distress.  Well-appearing, comfortable, cooperative, obese  HENT:  Head: Normocephalic and atraumatic.  Mouth/Throat: Oropharynx is clear and moist.  Frontal / maxillary sinuses non-tender. Nares patent without purulence or edema. Bilateral TMs clear without erythema, effusion or bulging. Oropharynx clear without erythema, exudates, edema or asymmetry.  Eyes: Conjunctivae and EOM are normal. Pupils are equal, round, and reactive to light. Right eye exhibits no discharge. Left eye exhibits no discharge.  Neck: Normal range of motion. Neck supple. No thyromegaly present.  Cardiovascular: Normal rate, regular rhythm, normal heart sounds and intact distal pulses.  No murmur heard. Pulmonary/Chest: Effort normal and breath sounds normal. No respiratory distress. She has no wheezes. She has no rales.  Abdominal: Soft. Bowel sounds are normal. She exhibits no distension and no mass. There is no tenderness.  Musculoskeletal: Normal range of motion. She exhibits no edema or tenderness.  Upper / Lower Extremities: - Normal muscle tone, strength bilateral upper extremities 5/5, lower extremities 5/5  Left Knees Inspection: Bulky appearance but symmetrical due to large body habitus and surrounding tissue. No ecchymosis or effusion. Palpation: Mild +TTP Left knee only lateral joint line.Positive crepitus ROM: Full active ROM bilaterally with pain on full extension and flexion Strength: 5/5  intact knee flex/ext, ankle dorsi/plantarflex Neurovascular: distally intact sensation light touch and pulses  Lymphadenopathy:    She has no cervical adenopathy.  Neurological: She is alert and oriented to person, place, and time.  Distal sensation intact to light touch all extremities  Skin: Skin is warm and dry. No rash noted. She is not diaphoretic. No erythema.  Psychiatric: She has a normal mood and affect. Her behavior is normal.  Well groomed, good eye contact, normal speech and thoughts  Nursing note and vitals reviewed.    Recent Labs    08/23/16 1141 11/25/16 1108 03/17/17 0916  HGBA1C 10.3* 7.6* 6.1*   Diabetic Foot Exam -  Simple   Simple Foot Form Diabetic Foot exam was performed with the following findings:  Yes 03/24/2017 10:50 AM  Visual Inspection No deformities, no ulcerations, no other skin breakdown bilaterally:  Yes Sensation Testing Intact to touch and monofilament testing bilaterally:  Yes Pulse Check Posterior Tibialis and Dorsalis pulse intact bilaterally:  Yes Comments     Results for orders placed or performed in visit on 03/14/17  Hepatitis C antibody  Result Value Ref Range   Hepatitis C Ab NON-REACTIVE NON-REACTI   SIGNAL TO CUT-OFF 0.03 <1.00  Hemoglobin A1c  Result Value Ref Range   Hgb A1c MFr Bld 6.1 (H) <5.7 % of total Hgb   Mean Plasma Glucose 128 (calc)   eAG (mmol/L) 7.1 (calc)  CBC with Differential/Platelet  Result Value Ref Range   WBC 9.7 3.8 - 10.8 Thousand/uL   RBC 4.01 3.80 - 5.10 Million/uL   Hemoglobin 10.8 (L) 11.7 - 15.5 g/dL   HCT 96.034.3 (L) 45.435.0 - 09.845.0 %   MCV 85.5 80.0 - 100.0 fL   MCH 26.9 (L) 27.0 - 33.0 pg   MCHC 31.5 (L) 32.0 - 36.0 g/dL   RDW 11.913.0 14.711.0 - 82.915.0 %   Platelets 394 140 - 400 Thousand/uL   MPV 11.3 7.5 - 12.5 fL   Neutro Abs 6,577 1,500 - 7,800 cells/uL   Lymphs Abs 2,086 850 - 3,900 cells/uL   WBC mixed population 640 200 - 950 cells/uL   Eosinophils Absolute 349 15 - 500 cells/uL   Basophils  Absolute 49 0 - 200 cells/uL   Neutrophils Relative % 67.8 %   Total Lymphocyte 21.5 %   Monocytes Relative 6.6 %   Eosinophils Relative 3.6 %   Basophils Relative 0.5 %  Lipid panel  Result Value Ref Range   Cholesterol 143 <200 mg/dL   HDL 31 (L) >56>50 mg/dL   Triglycerides 213132 <086<150 mg/dL   LDL Cholesterol (Calc) 89 mg/dL (calc)   Total CHOL/HDL Ratio 4.6 <5.0 (calc)   Non-HDL Cholesterol (Calc) 112 <130 mg/dL (calc)  COMPLETE METABOLIC PANEL WITH GFR  Result Value Ref Range   Glucose, Bld 133 (H) 65 - 99 mg/dL   BUN 25 7 - 25 mg/dL   Creat 5.781.68 (H) 4.690.50 - 1.05 mg/dL   GFR, Est Non African American 34 (L) > OR = 60 mL/min/1.3873m2   GFR, Est African American 40 (L) > OR = 60 mL/min/1.10073m2   BUN/Creatinine Ratio 15 6 - 22 (calc)   Sodium 140 135 - 146 mmol/L   Potassium 4.5 3.5 - 5.3 mmol/L   Chloride 105 98 - 110 mmol/L   CO2 27 20 - 32 mmol/L   Calcium 9.5 8.6 - 10.4 mg/dL   Total Protein 7.6 6.1 - 8.1 g/dL   Albumin 3.7 3.6 - 5.1 g/dL   Globulin 3.9 (H) 1.9 - 3.7 g/dL (calc)   AG Ratio 0.9 (L) 1.0 - 2.5 (calc)   Total Bilirubin 0.2 0.2 - 1.2 mg/dL   Alkaline phosphatase (APISO) 108 33 - 130 U/L   AST 11 10 - 35 U/L   ALT 11 6 - 29 U/L      Assessment & Plan:   Problem List Items Addressed This Visit    CKD (chronic kidney disease), stage III (HCC)    Most consistent with CKD-III based on Cr trend / GFR over past 1 year Likely secondary to DM / HTN for years, can be age related as well. Now encouraged that both DM /  HTN controlled Last Cr inc trend 1.58 to 1.68 Off NSAIDs >6-12 months or more Improve hydration as able Re-check Cr trend chemistry 3 months with lab - if still elevated >1.70 then definitely would recommend referral to Nephrologist for 2nd opinion, re-eval ACEi, may adjust thiazide or other testing      Controlled type 2 diabetes with neuropathy (HCC)    Still significantly improved now well controlled DM control A1c 6.1 from 7.6 (in past >10) Improved  lifestyle. Still limited exercise due to obesity and L knee OA/instability Complications - CKD-III  Plan:  1. Continue current meds - Metformin 1000mg  BID, Trulicity 1.5mg  weekly inj (tolerating well only brief period side effect nausea 1 hour) - no change 2. Encourage improved lifestyle - low carb, low sugar diet, reduce portion size, continue improving regular exercise 3. Check CBG , bring log to next visit for review 4. Continue ASA, ACEi, Statin 5. Advised to schedule DM ophtho exam, send record - again reviewed this - should schedule at [facility] as planned 6. Follow-up 3 months DM + labs/A1c  Also discussed Creatinine, will recheck if elevated may need Nephrology, likely related to DM, HTN, Age      Relevant Medications   lisinopril-hydrochlorothiazide (PRINZIDE,ZESTORETIC) 20-25 MG tablet   Essential hypertension    Well controlled, still improved Home reading improved - Complication with CKD-III  Plan:  1. Continue current BP regimen - Lisinopril-HCTZ 20-25mg  daily, Amlodipine 5mg  daily 2. Encourage improved lifestyle - low sodium diet, improve hydration, start improving regular exercise as tolerated - now refer to Orthopedics for L knee limiting her 3. Continue monitor BP outside office, bring readings to next visit, if persistently >140/90 or new symptoms notify office sooner 4. Follow-up 3 months + labs / if Cr trend increasing consider referral to Nephrology for evaluation - again reviewed this option      Relevant Medications   lisinopril-hydrochlorothiazide (PRINZIDE,ZESTORETIC) 20-25 MG tablet   Gastroesophageal reflux disease without esophagitis    Improved, now off PPI daily Only on intermittent PPI 40mg  pantoprazole Mixed symptoms seem triggered related to prior biliary colick and less GERD Adjust diet  May take 1-2 week PRN PPI      Relevant Medications   pantoprazole (PROTONIX) 40 MG tablet   Morbid obesity with BMI of 50.0-59.9, adult (HCC)    Gradual  improvement wt loss down 7 lbs in 4 months Improved diet and medicine changes now on max dose GLP1 Limited exercise still gradually improving home exercise, less cardio due to knee Defer referral to Weight Management Continue encourage improve diet / exercise as planned, continue goal wt loss 1 lb weekly for now      Osteoarthritis of left knee    Interval worsening chronic L generalized Knee pain with chronic soft tissue swelling without known injury or trauma - History of falls and unsteady gait due to instability of joint in past - Now more concern for meniscus injury with some mechanical locking and pain some instability - No prior history of knee surgery, arthroscopy - Inadequate conservative therapy  Plan: 1. Referral to [facility] for 2nd opinion - deferred x-ray today in office without injury or trauma  - new rx topical Diclofenac gel 2-3 time daily PRN knees for pain with CKD - Continue Tylenol PRN - Cannot take NSAIDs - CKD-III - May continue compression as tolerated, seemed to not help or make worse 2. RICE therapy (rest, ice, compression, elevation) for swelling, activity modification 3. Follow-up as needed - likely  will need imaging, PT vs injection, ultimately weight loss will help      Relevant Medications   diclofenac sodium (VOLTAREN) 1 % GEL    Other Visit Diagnoses    Annual physical exam    -  Primary See HM above in HPI To schedule mammo, GYN for pap, waiting on cost/coverage Cologuard then order Improve wt loss diet/exercise Follow-up    Screening for cervical cancer       Relevant Orders   Ambulatory referral to Obstetrics / Gynecology   Screening for breast cancer       Relevant Orders   MM DIGITAL SCREENING BILATERAL      Orders Placed This Encounter  Procedures  . MM DIGITAL SCREENING BILATERAL    Standing Status:   Future    Standing Expiration Date:   05/23/2018    Order Specific Question:   Reason for Exam (SYMPTOM  OR DIAGNOSIS REQUIRED)      Answer:   Routine screening bilateral mammogram. May change to Bilateral MM Tomo screening if indicated and appropriate for patient/insurance    Order Specific Question:   Preferred imaging location?    Answer:   [facility]  . Ambulatory referral to Obstetrics / Gynecology    Referral Priority:   Routine    Referral Type:   Consultation    Referral Reason:   Specialty Services Required    Requested Specialty:   Obstetrics and Gynecology    Number of Visits Requested:   1  . Ambulatory referral to Orthopedic Surgery    Referral Priority:   Routine    Referral Type:   Surgical    Referral Reason:   Specialty Services Required    Requested Specialty:   Orthopedic Surgery    Number of Visits Requested:   1     No orders of the defined types were placed in this encounter.   Follow up plan: Return in about 3 months (around 06/22/2017) for DM review lab A1c, CKD f/u, L knee pain.  Future order BMET Cr trend / A1c in 3 months  Saralyn Pilar, DO [facility] 03/24/2017, 10:36 AM

## 2017-03-24 NOTE — Assessment & Plan Note (Signed)
Improved, now off PPI daily Only on intermittent PPI 40mg  pantoprazole Mixed symptoms seem triggered related to prior biliary colick and less GERD Adjust diet  May take 1-2 week PRN PPI

## 2017-03-24 NOTE — Patient Instructions (Addendum)
Thank you for coming to the office today.  1. Keep up the great work overall! A1c much improved down to 6.1 from 7.6 in past  Continue current meds  Likely side effect on Trulicity should improve with time  2. Continue Pantoprazole as needed only up to 1-2 weeks  I am encouraged your nausea and symptoms are gradually improving  BP well controlled - refilled   3.  [facility]  [facility] [address], [address] [place], [place] [address] Hours: Lewayne Bunting8am - 5pm Main: [phone]   For Mammogram screening for breast cancer   Call the Imaging Center below anytime to schedule your own appointment now that order has been placed.  [facility] [address] [place], [place] [address] Phone: [phone] --------------------------------------  Try topical Diclofenac cream for anti inflammatory topical on knee up to 2-3 times daily as needed  Recommend to start taking Tylenol Extra Strength 500mg  tabs - take 1 to 2 tabs per dose (max 1000mg ) every 6-8 hours for pain (take regularly, don't skip a dose for next 7 days), max 24 hour daily dose is 6 tablets or 3000mg . In the future you can repeat the same everyday Tylenol course for 1-2 weeks at a time.   Avoid anti-inflammatories (advil, ibuprofen, aleve, naproxen,)  Stay tuned for apt  [facility] Address: [address], [place], [place] [address] Hours:  9AM-5PM Phone: [phone]  DUE for FASTING BLOOD WORK (no food or drink after midnight before the lab appointment, only water or coffee without cream/sugar on the morning of)  SCHEDULE "Lab Only" visit in the morning at the clinic for lab draw in 3 months  - Make sure Lab Only appointment is at about 1 week before your next appointment, so that results will be available  For Lab Results, once available within 2-3 days of blood draw, you can  can log in to MyChart online to view your results and a brief explanation. Also, we can discuss results at next follow-up visit.   Please schedule a Follow-up Appointment to: Return in about 3 months (around 06/22/2017) for DM review lab A1c, CKD f/u, L knee pain.    If you have any other questions or concerns, please feel free to call the office or send a message through MyChart. You may also schedule an earlier appointment if necessary.  Additionally, you may be receiving a survey about your experience at our office within a few days to 1 week by e-mail or mail. We value your feedback.  [name] Karamalegos, DO [facility]

## 2017-03-24 NOTE — Assessment & Plan Note (Signed)
Interval worsening chronic L generalized Knee pain with chronic soft tissue swelling without known injury or trauma - History of falls and unsteady gait due to instability of joint in past - Now more concern for meniscus injury with some mechanical locking and pain some instability - No prior history of knee surgery, arthroscopy - Inadequate conservative therapy  Plan: 1. Referral to [facility] for 2nd opinion - deferred x-ray today in office without injury or trauma  - new rx topical Diclofenac gel 2-3 time daily PRN knees for pain with CKD - Continue Tylenol PRN - Cannot take NSAIDs - CKD-III - May continue compression as tolerated, seemed to not help or make worse 2. RICE therapy (rest, ice, compression, elevation) for swelling, activity modification 3. Follow-up as needed - likely will need imaging, PT vs injection, ultimately weight loss will help

## 2017-03-24 NOTE — Assessment & Plan Note (Addendum)
Most consistent with CKD-III based on Cr trend / GFR over past 1 year Likely secondary to DM / HTN for years, can be age related as well. Now encouraged that both DM / HTN controlled Last Cr inc trend 1.58 to 1.68 Off NSAIDs >6-12 months or more Improve hydration as able Re-check Cr trend chemistry 3 months with lab - if still elevated >1.70 then definitely would recommend referral to Nephrologist for 2nd opinion, re-eval ACEi, may adjust thiazide or other testing

## 2017-06-18 ENCOUNTER — Other Ambulatory Visit: Payer: BC Managed Care – PPO

## 2017-06-19 ENCOUNTER — Other Ambulatory Visit: Payer: BC Managed Care – PPO

## 2017-06-20 ENCOUNTER — Other Ambulatory Visit: Payer: BC Managed Care – PPO

## 2017-06-20 LAB — HEMOGLOBIN A1C
HEMOGLOBIN A1C: 6.2 %{Hb} — AB (ref <5.7)
Mean Plasma Glucose: 131 (calc)
eAG (mmol/L): 7.3 (calc)

## 2017-06-21 LAB — BASIC METABOLIC PANEL WITH GFR
BUN/Creatinine Ratio: 15 (calc) (ref 6–22)
BUN: 27 mg/dL — AB (ref 7–25)
CALCIUM: 9.4 mg/dL (ref 8.6–10.4)
CHLORIDE: 105 mmol/L (ref 98–110)
CO2: 25 mmol/L (ref 20–32)
Creat: 1.81 mg/dL — ABNORMAL HIGH (ref 0.50–1.05)
GFR, EST AFRICAN AMERICAN: 36 mL/min/{1.73_m2} — AB (ref >=60)
GFR, Est Non African American: 31 mL/min/{1.73_m2} — ABNORMAL LOW (ref >=60)
Glucose, Bld: 121 mg/dL — ABNORMAL HIGH (ref 65–99)
POTASSIUM: 4.5 mmol/L (ref 3.5–5.3)
Sodium: 139 mmol/L (ref 135–146)

## 2017-06-23 ENCOUNTER — Ambulatory Visit: Payer: BC Managed Care – PPO | Admitting: Family Medicine

## 2017-06-26 ENCOUNTER — Ambulatory Visit: Payer: BC Managed Care – PPO | Admitting: Family Medicine

## 2017-06-26 ENCOUNTER — Encounter: Payer: Self-pay | Admitting: Family Medicine

## 2017-06-26 VITALS — BP 145/82 | HR 75 | Temp 98.3°F | Resp 16 | Ht 60.0 in | Wt 298.0 lb

## 2017-06-26 DIAGNOSIS — N183 Chronic kidney disease, stage 3 unspecified: Secondary | ICD-10-CM

## 2017-06-26 DIAGNOSIS — M1712 Unilateral primary osteoarthritis, left knee: Secondary | ICD-10-CM | POA: Diagnosis not present

## 2017-06-26 DIAGNOSIS — E1140 Type 2 diabetes mellitus with diabetic neuropathy, unspecified: Secondary | ICD-10-CM

## 2017-06-26 DIAGNOSIS — I10 Essential (primary) hypertension: Secondary | ICD-10-CM

## 2017-06-26 MED ORDER — AMLODIPINE BESYLATE 10 MG PO TABS: 10.0000 mg | ORAL_TABLET | Freq: Every day | ORAL | 12 refills | Status: DC

## 2017-06-26 NOTE — Assessment & Plan Note (Signed)
Previously improved control, then now off Amlodipine today since ran out did not notify office Home reading improved - Complication with CKD-III  Plan:  1. Continue current BP regimen - Lisinopril-HCTZ 20-25mg  daily, Amlodipine  daily (refilled Amlodipine) 2. Encourage improved lifestyle - low sodium diet, improve hydration, start improving regular exercise as tolerated -L knee limit 3. Continue monitor BP outside office, bring readings to next visit, if persistently >140/90 or new symptoms notify office sooner 4. Follow-up 3 months  Referral to Nephrology [facility] placed today - [place] Dr Wynelle Link - may need further work-up given progressive worsening Cr trend

## 2017-06-26 NOTE — Assessment & Plan Note (Signed)
Concern worsening Cr now 1.8 with reduced GFR CKD-III based on Cr trend / GFR over past 1 year Likely secondary to DM / HTN for years, can be age related as well. Now encouraged that both DM / HTN controlled Last Cr inc trend 1.68 to 1.8 Off NSAIDs >6-12 months or more Improve hydration as able  Referral to Nephrology [facility] placed today -  Dr Wynelle Link - may need further work-up given progressive worsening Cr trend  Advised her that if apt not scheduled within next 1-2 weeks, she can notify us back and then may consider holding ACEi-HCTZ as this may be contributing to elevated Cr, however it has been controlling her BP

## 2017-06-26 NOTE — Assessment & Plan Note (Addendum)
Still significantly improved DM control 6.2 from 6.1 Improved lifestyle. Still limited exercise due to obesity and L knee OA/instability Complications - CKD-III, neuropathy  Plan:  1. Continue current meds - Metformin  BID, Trulicity 1.5mg  weekly inj  2. Encourage improved lifestyle - low carb, low sugar diet, reduce portion size, continue improving regular exercise - still limited by knee 3. Check CBG , bring log to next visit for review 4. Continue ASA, ACEi, Statin 5. Advised to schedule DM ophtho exam, send record - again reviewed this - should schedule at [facility] as planned 6. Follow-up 3 months DM A1c  Referral to Nephrology [facility] placed today - [place] Dr Wynelle Link - may need further work-up given progressive worsening Cr trend

## 2017-06-26 NOTE — Patient Instructions (Addendum)
Thank you for coming to the office today.  Referral to Nephrology Dr Wynelle Link for 2nd opinion on some worsening chronic kidney function  [facility] ([facility] [id number] [place], [place] [address] Phone: [phone]   ----------------------------------- Referral to Ortho was sent in January, not sure why this was not scheduled - we will look into it  Feel free to call them as well.  [facility] Address: [address] [place], [place], [place] [address] Hours:  9AM-5PM Phone: [phone]   Please schedule a Follow-up Appointment to: Return in about 3 months (around 09/26/2017) for DM A1c, CKD (review nephro), HTN, Knee (ortho).  If you have any other questions or concerns, please feel free to call the office or send a message through MyChart. You may also schedule an earlier appointment if necessary.  Additionally, you may be receiving a survey about your experience at our office within a few days to 1 week by e-mail or mail. We value your feedback.  Saralyn Pilar, DO [facility], [place]

## 2017-06-26 NOTE — Assessment & Plan Note (Signed)
See last A&P 02/2017 Already referred to [facility] 02/2017 for L knee pain, she was never notified w/ apt or scheduled, our records show this referral was faxed but she did not follow-up it seems - We will check status, gave her their contact info and asked her to call see what barrier to scheduling was

## 2017-06-26 NOTE — Progress Notes (Signed)
Subjective:    Patient ID: Ashley Terry, female    DOB: [birth year], 52 y.o.   MRN: [id number]  Ashley Terry is a 52 y.o. female presenting on 06/26/2017 for Diabetes and Knee Pain (left side)   HPI  CHRONIC DM, Type 2: No new concerns. Last A1c 6.2, stable from previous 6.1, overall better controlled CBGs: Similar avg to last time - Avg120, Low>100, High< 150. Checks CBGs x 1 daily AM fasting Meds: Metformin  twice a day, Trulicity 1.5mg  injectable weekly (improved side effects) Currently on ACEi Awaiting [facility] DM eye report Denies hypoglycemia, numbness or tingling.  CKD-III - Interval history, with several lab draws with persistently elevated Creatinine previously Cr 1.2 to 1.5 now has increased over past 3-6 months up to 1.81, with GFR now < 40 approx - She reports that she has improved hydration and no longer taking NSAIDs as of last year when previously discussed, has been off NSAIDs for >6 to 9 months or more - She has not seen Nephrologist, agrees for referral today  FOLLOW-UP Left Knee Arthritis / Chronic Pain - Reports chronic problem still with Left Knee pain, she has been seen by [facility] in past, had some X-rays and Ultrasound negative for bakers cyst, do not see x-ray results. - Today no improvement since last visit, she never was seen by Orthopedics after referral to [facility] was placed back in 02/2017 - Admits occasional knee pop and pain, and sometimes feels like it will "lock up" -  She has some stiffness and soreness at first but then improved if "gets going" and with more activity, although it is more sore then later - Taking Tylenol PRN limited relief - Cannot take NSAIDs  CHRONIC HTN:  Previously BP was controlled, then ran out of Amlodipine and BP elevated and she can feel difference. Current Meds - Lisinopril-HCTZ 20-25mg , Amlodipine  daily(out for 2 weeks) - Ran out of Lisinopril at one point for 4-6 weeks but now has it  back Reports good compliance, took meds today. Tolerating well, w/o complaints.   Health Maintenance: Considered cologuard, not ready yet  - Due for Mammogram, has not scheduled yet   Depression screen [facility] 2/9 06/26/2017 03/24/2017 11/25/2016  Decreased Interest 0 0 0  Down, Depressed, Hopeless 0 0 0  PHQ - 2 Score 0 0 0    Social History   Tobacco Use  . Smoking status: Never Smoker  . Smokeless tobacco: Never Used  Substance Use Topics  . Alcohol use: No  . Drug use: No    Review of Systems Per HPI unless specifically indicated above     Objective:    BP (!) 145/82   Pulse 75   Temp 98.3 F (36.8 C) (Oral)   Resp 16   Ht 5' (1.524 m)   Wt 298 lb (135.2 kg)   BMI 58.20 kg/m   Wt Readings from Last 3 Encounters:  06/26/17 298 lb (135.2 kg)  03/24/17 289 lb (131.1 kg)  11/25/16 296 lb (134.3 kg)    Physical Exam  Constitutional: She is oriented to person, place, and time. She appears well-developed and well-nourished. No distress.  Well-appearing, comfortable, cooperative, obese  HENT:  Head: Normocephalic and atraumatic.  Mouth/Throat: Oropharynx is clear and moist.  Eyes: Conjunctivae are normal. Right eye exhibits no discharge. Left eye exhibits no discharge.  Cardiovascular: Normal rate, regular rhythm, normal heart sounds and intact distal pulses.  No murmur heard. Pulmonary/Chest: Effort normal and breath sounds normal.  No respiratory distress. She has no wheezes. She has no rales.  Musculoskeletal: Normal range of motion. She exhibits no edema or tenderness.  Upper / Lower Extremities: - Normal muscle tone, strength bilateral upper extremities 5/5, lower extremities 5/5  Left Knees Inspection: Bulky appearance but symmetrical due to large body habitus and surrounding tissue. No ecchymosis or effusion. Palpation: Similar to last exam, mild +TTP Left knee only lateral joint line.Positive crepitus ROM: Full active ROM bilaterally with pain on full  extension and flexion Strength: 5/5 intact knee flex/ext, ankle dorsi/plantarflex Neurovascular: distally intact sensation light touch and pulses  Neurological: She is alert and oriented to person, place, and time.  Distal sensation intact to light touch all extremities  Skin: Skin is warm and dry. No rash noted. She is not diaphoretic. No erythema.  Psychiatric: She has a normal mood and affect. Her behavior is normal.  Well groomed, good eye contact, normal speech and thoughts  Nursing note and vitals reviewed.  Results for orders placed or performed in visit on 06/18/17  Hemoglobin A1c  Result Value Ref Range   Hgb A1c MFr Bld 6.2 (H) <5.7 % of total Hgb   Mean Plasma Glucose 131 (calc)   eAG (mmol/L) 7.3 (calc)  BASIC METABOLIC PANEL WITH GFR  Result Value Ref Range   Glucose, Bld 121 (H) 65 - 99 mg/dL   BUN 27 (H) 7 - 25 mg/dL   Creat 1.61 (H) 0.96 - 1.05 mg/dL   GFR, Est Non African American 31 (L) > OR = 60 mL/min/1.7m2   GFR, Est African American 36 (L) > OR = 60 mL/min/1.38m2   BUN/Creatinine Ratio 15 6 - 22 (calc)   Sodium 139 135 - 146 mmol/L   Potassium 4.5 3.5 - 5.3 mmol/L   Chloride 105 98 - 110 mmol/L   CO2 25 20 - 32 mmol/L   Calcium 9.4 8.6 - 10.4 mg/dL   Recent Labs    04/54/09 1108 03/17/17 0916 06/20/17 0829  HGBA1C 7.6* 6.1* 6.2*       Assessment & Plan:   Problem List Items Addressed This Visit    CKD (chronic kidney disease), stage III (HCC) - Primary    Concern worsening Cr now 1.8 with reduced GFR CKD-III based on Cr trend / GFR over past 1 year Likely secondary to DM / HTN for years, can be age related as well. Now encouraged that both DM / HTN controlled Last Cr inc trend 1.68 to 1.8 Off NSAIDs >6-12 months or more Improve hydration as able  Referral to Nephrology [facility] placed today - [place] Dr Wynelle Link - may need further work-up given progressive worsening Cr trend  Advised her that if apt not scheduled within next 1-2 weeks, she can  notify us back and then may consider holding ACEi-HCTZ as this may be contributing to elevated Cr, however it has been controlling her BP      Relevant Orders   Ambulatory referral to Nephrology   Controlled type 2 diabetes with neuropathy (HCC)    Still significantly improved DM control 6.2 from 6.1 Improved lifestyle. Still limited exercise due to obesity and L knee OA/instability Complications - CKD-III, neuropathy  Plan:  1. Continue current meds - Metformin  BID, Trulicity 1.5mg  weekly inj  2. Encourage improved lifestyle - low carb, low sugar diet, reduce portion size, continue improving regular exercise - still limited by knee 3. Check CBG , bring log to next visit for review 4. Continue ASA, ACEi, Statin 5.  Advised to schedule DM ophtho exam, send record - again reviewed this - should schedule at [facility] as planned 6. Follow-up 3 months DM A1c  Referral to Nephrology [facility] placed today - [place] Dr Wynelle Link - may need further work-up given progressive worsening Cr trend      Essential hypertension    Previously improved control, then now off Amlodipine today since ran out did not notify office Home reading improved - Complication with CKD-III  Plan:  1. Continue current BP regimen - Lisinopril-HCTZ 20-25mg  daily, Amlodipine  daily (refilled Amlodipine) 2. Encourage improved lifestyle - low sodium diet, improve hydration, start improving regular exercise as tolerated -L knee limit 3. Continue monitor BP outside office, bring readings to next visit, if persistently >140/90 or new symptoms notify office sooner 4. Follow-up 3 months  Referral to Nephrology [facility] placed today - [place] Dr Wynelle Link - may need further work-up given progressive worsening Cr trend      Relevant Medications   amLODipine (NORVASC) 10 MG tablet   Osteoarthritis of left knee    See last A&P 02/2017 Already referred to [facility] 02/2017 for L knee pain, she was never notified w/ apt or  scheduled, our records show this referral was faxed but she did not follow-up it seems - We will check status, gave her their contact info and asked her to call see what barrier to scheduling was         Meds ordered this encounter  Medications  . amLODipine (NORVASC) 10 MG tablet    Sig: Take 1 tablet (10 mg total) by mouth daily.    Dispense:  30 tablet    Refill:  12   Orders Placed This Encounter  Procedures  . Ambulatory referral to Nephrology    Referral Priority:   Routine    Referral Type:   Consultation    Referral Reason:   Specialty Services Required    Referred to Provider:   Lamont Dowdy, MD    Requested Specialty:   Internal Medicine    Number of Visits Requested:   1    Follow up plan: Return in about 3 months (around 09/26/2017) for DM A1c, CKD (review nephro), HTN, Knee (ortho).  Saralyn Pilar, DO [facility] 06/26/2017, 5:58 PM

## 2017-07-22 ENCOUNTER — Encounter: Payer: Self-pay | Admitting: Family Medicine

## 2017-07-22 ENCOUNTER — Ambulatory Visit: Payer: BC Managed Care – PPO | Admitting: Family Medicine

## 2017-07-22 VITALS — BP 131/72 | HR 73 | Temp 98.5°F | Resp 16 | Ht 60.0 in | Wt 293.0 lb

## 2017-07-22 DIAGNOSIS — A084 Viral intestinal infection, unspecified: Secondary | ICD-10-CM | POA: Diagnosis not present

## 2017-07-22 DIAGNOSIS — R112 Nausea with vomiting, unspecified: Secondary | ICD-10-CM

## 2017-07-22 MED ORDER — ONDANSETRON 4 MG PO TBDP: 4.0000 mg | ORAL_TABLET | Freq: Three times a day (TID) | ORAL | 0 refills | Status: DC | PRN

## 2017-07-22 NOTE — Patient Instructions (Addendum)
Thank you for coming to the office today.  Request that you call the kidney specialist to see when you have a new patient appointment, as they will likely check some blood work.  Referral to Nephrology Dr Wynelle Link for 2nd opinion on some worsening chronic kidney function  [facility] [address] [place], [place] [address] Phone: [phone]   If they cannot see you for 4-6 weeks from now, AND your symptoms do not resolve within few days, then we may reconsider returning for some blood work.  Now gradually add back solid foods, keep to bland foods and keep improving hydration, G2 Gatorade or Apple Juice mixed with water (equal parts, 1:1 mixture)  Rx Zofran as needed for nausea, when increasing food solids  Try Imodium OTC for diarrhea as needed in future if still having diarrhea  If significant worsening abdominal pain, worsening fever, chills, sweats, nausea vomiting cannot keep any food or fluids down, worse dehydration  Please schedule a Follow-up Appointment to: Return if symptoms worsen or fail to improve, for viral gastroenteritis.  If you have any other questions or concerns, please feel free to call the office or send a message through MyChart. You may also schedule an earlier appointment if necessary.  Additionally, you may be receiving a survey about your experience at our office within a few days to 1 week by e-mail or mail. We value your feedback.  Saralyn Pilar, DO [facility] ([place]), [place]

## 2017-07-22 NOTE — Progress Notes (Signed)
Subjective:    Patient ID: Ashley Terry, female    DOB: 11/06/1965, 52 y.o.   MRN: [id number]  Ashley Terry is a 52 y.o. female presenting on 07/22/2017 for Nausea (jitter feeling RLQ abdominal pain and dizzy, little chills onset 5 days) and Diarrhea  Patient presents for a same day appointment.  HPI   NAUSEA, VOMITING, RIGHT ABDOMINAL PAIN Reports symptoms started about 5 days ago with nausea and diarrhea, she initially thought it was something she ate some spicy food at onset of symptoms, and no one else she ate lunch with had similar GI symptoms. Friday evening she felt some slight chills and possible low grade temp. Over weekend she felt worse with feeling "awful" could not keep any food or fluids down, she did feel more sluggish and no energy to do anything. Additionally she admits Right sided upper abdominal pain more severe sharp pain over weekend, worse with deep breath, R flank pain, and then it has improved over past 2 days to a more constant present dull aching on R side. - Today reports some gradual improvement - last episode of vomiting was Sunday morning (2 days ago), and last diarrhea was last night. She has limited her PO intake, now she is drinking only liquids - water and gatorade, still no solids. - Taking some Tylenol PRN only for chills and temp. But not helping her abdominal pain. Not taking NSAIDs - History of cholecystectomy. History of C-section, no other abdominal surgery, see below - She takes Trulicity on Sunday, did not seem to be related, as onset was 2 days before. - Admits a little jittery or possible chills - Admits some radiation of pain to her back Denies alcohol or other triggers Denies other areas of abdominal pain, active nausea, dizziness, lightheadedness, chest pain, dyspnea, hematuria, dysuria  Additional update - has not heard back from [facility] - awaiting on new patient appointment still.  Past Surgical History:  Procedure  Laterality Date  . CESAREAN SECTION  1980  . CESAREAN SECTION  1992  . CHOLECYSTECTOMY N/A 04/16/2016   Procedure: LAPAROSCOPIC CHOLECYSTECTOMY;  Surgeon: Lattie Haw, MD;  Location: [facility] ORS;  Service: General;  Laterality: N/A;  . TONSILECTOMY, ADENOIDECTOMY, BILATERAL MYRINGOTOMY AND TUBES  1980  . TUBAL LIGATION  1994    Depression screen [facility] 2/9 06/26/2017 03/24/2017 11/25/2016  Decreased Interest 0 0 0  Down, Depressed, Hopeless 0 0 0  PHQ - 2 Score 0 0 0    Social History   Tobacco Use  . Smoking status: Never Smoker  . Smokeless tobacco: Never Used  Substance Use Topics  . Alcohol use: No  . Drug use: No    Review of Systems Per HPI unless specifically indicated above     Objective:    BP 131/72   Pulse 73   Temp 98.5 F (36.9 C) (Oral)   Resp 16   Ht 5' (1.524 m)   Wt 293 lb (132.9 kg)   SpO2 100%   BMI 57.22 kg/m   Wt Readings from Last 3 Encounters:  07/22/17 293 lb (132.9 kg)  06/26/17 298 lb (135.2 kg)  03/24/17 289 lb (131.1 kg)    Physical Exam  Constitutional: She is oriented to person, place, and time. She appears well-developed and well-nourished. No distress.  Mostly well-appearing, slight discomfort on exam abdomen, cooperative, morbidly obese  HENT:  Head: Normocephalic and atraumatic.  Mouth/Throat: Oropharynx is clear and moist.  Eyes: Conjunctivae are normal. Right eye  exhibits no discharge. Left eye exhibits no discharge.  Neck: Normal range of motion. Neck supple.  Cardiovascular: Normal rate, regular rhythm, normal heart sounds and intact distal pulses.  No murmur heard. Pulmonary/Chest: Effort normal and breath sounds normal. No respiratory distress. She has no wheezes. She has no rales.  Abdominal: Soft. Bowel sounds are normal. She exhibits no distension and no mass. There is tenderness (RUQ abdominal pain reproduced on exam, some generalized pain across ribs and flank as well). There is no rebound and no guarding.    Musculoskeletal: Normal range of motion.  Neurological: She is alert and oriented to person, place, and time.  Skin: Skin is warm and dry. No rash noted. She is not diaphoretic. No erythema.  Psychiatric: Her behavior is normal.  Nursing note and vitals reviewed.  Results for orders placed or performed in visit on 06/18/17  Hemoglobin A1c  Result Value Ref Range   Hgb A1c MFr Bld 6.2 (H) <5.7 % of total Hgb   Mean Plasma Glucose 131 (calc)   eAG (mmol/L) 7.3 (calc)  BASIC METABOLIC PANEL WITH GFR  Result Value Ref Range   Glucose, Bld 121 (H) 65 - 99 mg/dL   BUN 27 (H) 7 - 25 mg/dL   Creat 4.09 (H) 8.11 - 1.05 mg/dL   GFR, Est Non African American 31 (L) > OR = 60 mL/min/1.9m2   GFR, Est African American 36 (L) > OR = 60 mL/min/1.75m2   BUN/Creatinine Ratio 15 6 - 22 (calc)   Sodium 139 135 - 146 mmol/L   Potassium 4.5 3.5 - 5.3 mmol/L   Chloride 105 98 - 110 mmol/L   CO2 25 20 - 32 mmol/L   Calcium 9.4 8.6 - 10.4 mg/dL      Assessment & Plan:   Problem List Items Addressed This Visit    None    Visit Diagnoses    Viral gastroenteritis    -  Primary   Relevant Medications   ondansetron (ZOFRAN ODT) 4 MG disintegrating tablet   Nausea and vomiting, intractability of vomiting not specified, unspecified vomiting type       Relevant Medications   ondansetron (ZOFRAN ODT) 4 MG disintegrating tablet      Consistent with viral gastroenteritis, x 4-5 days now w/ both diarrhea / vomiting - now significant improvement in 24 hours with resolved nausea vomiting and diarrhea - has not increased back to regular diet. No known sick contacts. Possible trigger eating out spicy food at onset. - Currently well appearing and non-toxic, clinically well hydrated on exam - abdomen does have some pain RUQ reproduced on exam, but not rebound or guarding or RLQ or other areas.  Plan: 1. Reassurance, likely self-limited 7-10 days 2. Continue PO as tolerated. May try to increase hydration, clear  fluids (gatorade, pedialyate, water) - and goal to increase back to regular diet as tolerated - Rx Zofran  ODT q 8 hr PRN to help improve diet - may take OTC imodium PRN diarrhea if it returns 3. Continue Tylenol high dose PRN for fever/pain - Not consistent with cramping, will not add dicyclomine - Discussed may need blood work if significant dehydration or worsens - for Cr function - however since tolerating fluids and not clinically dehydrated on exam today, reassurance Strict follow-up and return criteria reviewed - when to go to hospital ED  Also she should call [facility] to check status of her new patient appointment as they will likely draw blood work as well to  check kidney function.   Meds ordered this encounter  Medications  . ondansetron (ZOFRAN ODT) 4 MG disintegrating tablet    Sig: Take 1 tablet (4 mg total) by mouth every 8 (eight) hours as needed for nausea or vomiting.    Dispense:  30 tablet    Refill:  0    Follow up plan: Return if symptoms worsen or fail to improve, for viral gastroenteritis.   Saralyn Pilar, DO [facility] 07/22/2017, 10:27 AM

## 2017-08-26 LAB — HM DIABETES EYE EXAM

## 2017-08-29 ENCOUNTER — Other Ambulatory Visit: Payer: Self-pay | Admitting: Nephrology

## 2017-08-29 DIAGNOSIS — N183 Chronic kidney disease, stage 3 unspecified: Secondary | ICD-10-CM

## 2017-09-08 ENCOUNTER — Ambulatory Visit
Admission: RE | Admit: 2017-09-08 | Discharge: 2017-09-08 | Disposition: A | Discharge status: Home / Self Care | Payer: BC Managed Care – PPO | Source: Ambulatory Visit | Attending: Nephrology | Admitting: Nephrology

## 2017-09-08 DIAGNOSIS — N183 Chronic kidney disease, stage 3 unspecified: Secondary | ICD-10-CM

## 2017-10-03 ENCOUNTER — Ambulatory Visit: Payer: BC Managed Care – PPO | Admitting: Family Medicine

## 2017-10-03 ENCOUNTER — Encounter: Payer: Self-pay | Admitting: Family Medicine

## 2017-10-03 VITALS — BP 126/70 | HR 68 | Temp 98.0°F | Resp 16 | Ht 60.0 in | Wt 295.0 lb

## 2017-10-03 DIAGNOSIS — E1169 Type 2 diabetes mellitus with other specified complication: Secondary | ICD-10-CM

## 2017-10-03 DIAGNOSIS — N183 Chronic kidney disease, stage 3 unspecified: Secondary | ICD-10-CM

## 2017-10-03 DIAGNOSIS — Z1231 Encounter for screening mammogram for malignant neoplasm of breast: Secondary | ICD-10-CM

## 2017-10-03 DIAGNOSIS — I10 Essential (primary) hypertension: Secondary | ICD-10-CM

## 2017-10-03 DIAGNOSIS — E1140 Type 2 diabetes mellitus with diabetic neuropathy, unspecified: Secondary | ICD-10-CM

## 2017-10-03 DIAGNOSIS — Z1239 Encounter for other screening for malignant neoplasm of breast: Secondary | ICD-10-CM

## 2017-10-03 DIAGNOSIS — E785 Hyperlipidemia, unspecified: Secondary | ICD-10-CM

## 2017-10-03 LAB — POCT GLYCOSYLATED HEMOGLOBIN (HGB A1C): Hemoglobin A1C: 6.2 % — AB (ref 4.0–5.6)

## 2017-10-03 MED ORDER — METFORMIN HCL 1000 MG PO TABS: 1000.0000 mg | ORAL_TABLET | Freq: Two times a day (BID) | ORAL | 3 refills | Status: DC

## 2017-10-03 MED ORDER — ATORVASTATIN CALCIUM 40 MG PO TABS: 40.0000 mg | ORAL_TABLET | Freq: Every day | ORAL | 3 refills | Status: DC

## 2017-10-03 MED ORDER — SEMAGLUTIDE(0.25 OR 0.5MG/DOS) 2 MG/1.5ML ~~LOC~~ SOPN: 0.5000 mg | PEN_INJECTOR | SUBCUTANEOUS | 2 refills | Status: DC

## 2017-10-03 NOTE — Progress Notes (Signed)
Subjective:    Patient ID: Ashley Terry, female    DOB: 05/23/1965, 52 y.o.   MRN: [id number]  Ashley Terry is a 52 y.o. female presenting on 10/03/2017 for Diabetes   HPI   CHRONIC DM, Type 2: Doing well. No new concerns. Recent A1c since 02/2017 - 6.1 to 6.2, now today A1c 6.2. CBGs: Similar avg to last time - Avg120, Low>100, High<150. 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  CKD-III / Vitamin D deficiency Followed by [facility] Dr Cherylann RatelLateef. Last seen 09/29/17, reviewed note scanned into system, consistent with dx CKD-III with DM/HTN. Other work up was negative SPEP UPEP, Renal US. Remains off NSAIDs. She was advised to continue Lisinopril for renal protection, continue other meds for BP. - She was given Vitamin D 50k weekly for 8-16 weeks  FOLLOW-UPLeft Knee Arthritis/ Chronic Pain Followed by [facility], she has pursued x-rays, injection treatment and now scheduled by [facility] for 8/29 for L knee surgery by Dr Odis LusterBowers - Cannot take NSAIDs due to CKD  HYPERLIPIDEMIA: - Reports no concerns. Last lipid panel 02/2017, mostly controlled  - Currently taking Atorvastatin 40mg  daily, tolerating well without side effects or myalgias  CHRONIC HTN: Doing well, no new concerns. Home BP improved. Current Meds - Lisinopril-HCTZ 20-25mg , Amlodipine 10mg  daily Reports good compliance, took meds today. Tolerating well, w/o complaints.  Health Maintenance: Due for Mammogram - she did not schedule last visit, will try to schedule now. Due for Flu Shot will return when in stock  Dep[facility] screen [facility] 2/9 10/03/2017  06/26/2017 03/24/2017  Decreased Interest 0 0 0  Down, Depressed, Hopeless 0 0 0  PHQ - 2 Score 0 0 0    Social History   Tobacco Use  . Smoking status: Never Smoker  . Smokeless tobacco: Never Used  Substance Use Topics  . Alcohol use: No  . Drug use: No    Review of Systems Per HPI unless specifically indicated above     Objective:    BP 126/70   Pulse 68   Temp 98 F (36.7 C) (Oral)   Resp 16   Ht 5' (1.524 m)   Wt 295 lb (133.8 kg)   BMI 57.61 kg/m   Wt Readings from Last 3 Encounters:  10/03/17 295 lb (133.8 kg)  07/22/17 293 lb (132.9 kg)  06/26/17 298 lb (135.2 kg)    Physical Exam  Constitutional: She is oriented to person, place, and time. She appears well-developed and well-nourished. No distress.  Well-appearing, comfortable, cooperative, obese  HENT:  Head: Normocephalic and atraumatic.  Mouth/Throat: Oropharynx is clear and moist.  Eyes: Conjunctivae are normal. Right eye exhibits no discharge. Left eye exhibits no discharge.  Neck: Normal range of motion. Neck supple. No thyromegaly present.  Cardiovascular: Normal rate, regular rhythm, normal heart sounds and intact distal pulses.  No murmur heard. Pulmonary/Chest: Effort normal and breath sounds normal. No respiratory distress. She has no wheezes. She has no rales.  Musculoskeletal: Normal range of motion. She exhibits no edema.  Lymphadenopathy:    She has no cervical adenopathy.  Neurological: She is alert and oriented to person, place, and time.  Skin: Skin  is warm and dry. No rash noted. She is not diaphoretic. No erythema.  Psychiatric: She has a normal mood and affect. Her behavior is normal.  Well groomed, good eye contact, normal speech and thoughts  Nursing note and vitals reviewed.  Recent Labs    03/17/17 0916 06/20/17 0829 10/03/17 0959  HGBA1C 6.1* 6.2* 6.2*    Results for orders placed or performed in visit on 10/03/17  POCT HgB A1C  Result Value Ref Range   Hemoglobin A1C  6.2 (A) 4.0 - 5.6 %      Assessment & Plan:   Problem List Items Addressed This Visit    CKD (chronic kidney disease), stage III (HCC)    Persistent CKD-III, secondary to DM/HTN, history of NSAIDs Followed by [facility] Dr Cherylann Ratel Negative other work-up for CKD On ACEi for renal protection On Vitamin D Follow-up as planned      Controlled type 2 diabetes with neuropathy (HCC) - Primary    Stable DM with A1c 6.2 unchanged. Without significant hyperglycemia. 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 DISCONTINUE Trulicity - will switch to Ozempic due to possible increased benefits of A1c control and weight loss as well as reduced MACE outcomes - START Ozempic 0.25mg  weekly for 4 weeks then increase to 0.5mg  weekly inj - reviewed benefits, side effect, and given manufacturer copay discount card today - Continue current meds - Metformin 1000mg  BID 2. Encourage improved lifestyle - low carb, low sugar diet, reduce portion size, continue improving regular exercise - still limited by knee 3. Check CBG , bring log to next visit for review 4. Continue ASA, ACEi, Statin 5. Records request for [facility] submitted today 6. Follow-up 3 months DM A1c      Relevant Medications   atorvastatin (LIPITOR) 40 MG tablet   Semaglutide (OZEMPIC) 0.25 or 0.5 MG/DOSE SOPN   metFORMIN (GLUCOPHAGE) 1000 MG tablet   Other Relevant Orders   POCT HgB A1C (Completed)   Essential hypertension    Controlled HTN on current regimen Complicated by CKD-III Continue Lisinopril-HCTZ-20-25mg  daily, Amlodipine 10mg  daily Improve diet, lifestyle Monitor BP Follow-up      Relevant Medications   atorvastatin (LIPITOR) 40 MG tablet   Hyperlipidemia associated with type 2 diabetes mellitus (HCC)     Not due for repeat lipids, last done 02/2017 improved Continues on Atorvastatin 40mg  - refilled today Encourage lifestyle improve      Relevant Medications   atorvastatin (LIPITOR) 40 MG tablet   Semaglutide (OZEMPIC) 0.25 or 0.5 MG/DOSE SOPN   metFORMIN (GLUCOPHAGE) 1000 MG tablet    Other Visit Diagnoses    Screening for breast cancer        patient to schedule mammogram at [facility] - order already in   Meds ordered this encounter  Medications  . atorvastatin (LIPITOR) 40 MG tablet    Sig: Take 1 tablet (40 mg total) by mouth at bedtime.    Dispense:  90 tablet    Refill:  3  . Semaglutide (OZEMPIC) 0.25 or 0.5 MG/DOSE SOPN    Sig: Inject 0.5 mg into the skin once a week. For first 4 weeks, inject dose 0.25mg  weekly    Dispense:  1 pen    Refill:  2    Stop taking Trulicity, switch to Ozempic  . metFORMIN (GLUCOPHAGE) 1000 MG  tablet    Sig: Take 1 tablet (1,000 mg total) by mouth 2 (two) times daily with a meal.    Dispense:  180 tablet    Refill:  3    Follow up plan: Return in about 3 months (around 01/03/2018) for DM A1c, med adjust ozempic.  Saralyn Pilar, DO [facility] 10/03/2017, 10:09 AM

## 2017-10-03 NOTE — Assessment & Plan Note (Signed)
Not due for repeat lipids, last done 02/2017 improved Continues on Atorvastatin 40mg  - refilled today Encourage lifestyle improve

## 2017-10-03 NOTE — Assessment & Plan Note (Addendum)
Stable DM with A1c 6.2 unchanged. Without significant hyperglycemia. 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 DISCONTINUE Trulicity - will switch to Ozempic due to possible increased benefits of A1c control and weight loss as well as reduced MACE outcomes - START Ozempic 0.25mg  weekly for 4 weeks then increase to 0.5mg  weekly inj - reviewed benefits, side effect, and given manufacturer copay discount card today - Continue current meds - Metformin 1000mg  BID 2. Encourage improved lifestyle - low carb, low sugar diet, reduce portion size, continue improving regular exercise - still limited by knee 3. Check CBG , bring log to next visit for review 4. Continue ASA, ACEi, Statin 5. [facility] 6. Follow-up 3 months DM A1c

## 2017-10-03 NOTE — Assessment & Plan Note (Signed)
Persistent CKD-III, secondary to DM/HTN, history of NSAIDs Followed by [facility] Dr Cherylann RatelLateef Negative other work-up for CKD On ACEi for renal protection On Vitamin D Follow-up as planned

## 2017-10-03 NOTE — Assessment & Plan Note (Signed)
Controlled HTN on current regimen Complicated by CKD-III Continue Lisinopril-HCTZ-20-25mg  daily, Amlodipine 10mg  daily Improve diet, lifestyle Monitor BP Follow-up

## 2017-10-03 NOTE — Patient Instructions (Addendum)
Thank you for coming to the office today.  For Vitamin D - take 50,000 units WEEKLY - then after 8-16 weeks then can start OTC Vitamin D3 2,000 iu DAILY  May finish remaining Trulicity - or can keep it - and start new Ozempic - 0.25mg  weekly injection for FIRST FOUR weeks then increase to next dose 0.5mg  weekly until you return to office.  A1c today is 6.2, very stable and well controlled  Discussed lifestyle keep trying to improve diet as discussed  Refilled Metformin and Atorvastatin  For Mammogram screening for breast cancer   Call the Imaging Center below anytime to schedule your own appointment now that order has been placed.  [facility] [address] [place], [place] [address] Phone: [phone]  Please schedule a Follow-up Appointment to: Return in about 3 months (around 01/03/2018) for DM A1c, med adjust ozempic.  If you have any other questions or concerns, please feel free to call the office or send a message through MyChart. You may also schedule an earlier appointment if necessary.  Additionally, you may be receiving a survey about your experience at our office within a few days to 1 week by e-mail or mail. We value your feedback.  [name] Ronesha Heenan, DO [facility]

## 2017-12-15 ENCOUNTER — Encounter: Payer: Self-pay | Admitting: Family Medicine

## 2018-01-09 ENCOUNTER — Encounter: Payer: Self-pay | Admitting: Family Medicine

## 2018-01-09 ENCOUNTER — Other Ambulatory Visit: Payer: Self-pay | Admitting: Family Medicine

## 2018-01-09 ENCOUNTER — Ambulatory Visit: Payer: BC Managed Care – PPO | Admitting: Family Medicine

## 2018-01-09 VITALS — BP 138/78 | HR 72 | Temp 98.5°F | Resp 16 | Ht 60.0 in | Wt 308.0 lb

## 2018-01-09 DIAGNOSIS — N183 Chronic kidney disease, stage 3 unspecified: Secondary | ICD-10-CM

## 2018-01-09 DIAGNOSIS — M7662 Achilles tendinitis, left leg: Secondary | ICD-10-CM | POA: Diagnosis not present

## 2018-01-09 DIAGNOSIS — Z6841 Body Mass Index (BMI) 40.0 and over, adult: Secondary | ICD-10-CM

## 2018-01-09 DIAGNOSIS — D509 Iron deficiency anemia, unspecified: Secondary | ICD-10-CM

## 2018-01-09 DIAGNOSIS — E785 Hyperlipidemia, unspecified: Secondary | ICD-10-CM

## 2018-01-09 DIAGNOSIS — E1140 Type 2 diabetes mellitus with diabetic neuropathy, unspecified: Secondary | ICD-10-CM | POA: Diagnosis not present

## 2018-01-09 DIAGNOSIS — Z23 Encounter for immunization: Secondary | ICD-10-CM

## 2018-01-09 DIAGNOSIS — E1169 Type 2 diabetes mellitus with other specified complication: Secondary | ICD-10-CM

## 2018-01-09 DIAGNOSIS — Z0000 Encounter for general adult medical examination without abnormal findings: Secondary | ICD-10-CM

## 2018-01-09 DIAGNOSIS — I10 Essential (primary) hypertension: Secondary | ICD-10-CM

## 2018-01-09 DIAGNOSIS — Z1211 Encounter for screening for malignant neoplasm of colon: Secondary | ICD-10-CM

## 2018-01-09 DIAGNOSIS — K219 Gastro-esophageal reflux disease without esophagitis: Secondary | ICD-10-CM

## 2018-01-09 LAB — POCT GLYCOSYLATED HEMOGLOBIN (HGB A1C): Hemoglobin A1C: 6.9 % — AB (ref 4.0–5.6)

## 2018-01-09 MED ORDER — METFORMIN HCL ER 750 MG PO TB24: 1500.0000 mg | ORAL_TABLET | Freq: Every day | ORAL | 1 refills | Status: DC

## 2018-01-09 NOTE — Assessment & Plan Note (Addendum)
Elevated BP, improve on re-check. Mostly controlled on meds Complicated by CKD-III    Plan:  1.  Continue current BP regimen - Lisinopril-HCTZ 20-25mg  daily, Amlodipine 10mg  2. Encourage improved lifestyle - low sodium diet, regular exercise 3. Continue monitor BP outside office, bring readings to next visit, if persistently >140/90 or new symptoms notify office sooner

## 2018-01-09 NOTE — Progress Notes (Signed)
Subjective:    Patient ID: Ashley Terry, female    DOB: Oct 03, 1965, 52 y.o.   MRN: [id number]  Ashley Terry is a 52 y.o. female presenting on 01/09/2018 for Diabetes   HPI  CHRONIC DM, Type 2: Doing well. No new concerns.Recent A1c since 02/2017 - 6.1 to 6.2 Today A1c up to 6.9 Interval update - since last visit 09/2017 in 3 months she has STOPPED all GLP1 medicines, she has trulicity and was given new rx Ozempic 0.25 to 0.5, we advised to stop trulicity and start Ozempic, but she was confused by pharmacy still sending her trulicity, so she did not take either, and did not contact us. - Today asking what to take CBGs:Similar avg to last time -Avg120, Low>100, High<170. Checks CBGs x 1 daily AM fasting Meds: Metformin 1000mg  twice a day - Trulicity1.5mg  injectable weekly(improved side effects) - HOLDING CURRENTLY has not restarted - Ozempic 0.25 (NEVER STARTED, has med) Currently on ACEi Known DM Retinopathy L only mild non-prolif, w/ macular edema, not affecting R eye -[facility] last visit 08/2017 Lifestyle: - Diet: Limited sometimes poor choices for breakfast or skip meals due to work, still improving Denies hypoglycemia,numbness or tingling.  Left Ankle Pain / FOLLOW-UPLeft Knee Arthritis/ Chronic Pain Followed by [facility], she has pursued x-rays, injection treatment and mostly recently by [facility] - Today reports still has knee pain, limiting her function - Now she has Left ankle pain in back of ankle affecting her, worse with prolong standing or activity - Cannot take NSAIDs due to CKD Denies any injury, trauma, fall, swelling redness, bruising  CHRONIC HTN: Previously has done well, home BP readings 120s-134/70s. Not checking recently. Attributes BP elevated today due to stress and pain. Current Meds - Lisinopril-HCTZ 20-25mg , Amlodipine 10mg  daily Reports good compliance, took meds today. Tolerating well, w/o complaints.  Health Maintenance: Due  for Flu Shot, will receive today   She will need to call ins to check coverage of cologuard, she request today to have it ordered and will proceed.  She has not scheduled Mammogram yet, she will call soon to schedule [facility], order is in.   Depression screen [facility] 2/9 01/09/2018 10/03/2017 06/26/2017  Decreased Interest 0 0 0  Down, Depressed, Hopeless 0 0 0  PHQ - 2 Score 0 0 0    Social History   Tobacco Use  . Smoking status: Never Smoker  . Smokeless tobacco: Never Used  Substance Use Topics  . Alcohol use: No  . Drug use: No    Review of Systems Per HPI unless specifically indicated above     Objective:    BP 138/78 (BP Location: Left Arm, Cuff Size: Large)   Pulse 72   Temp 98.5 F (36.9 C) (Oral)   Resp 16   Ht 5' (1.524 m)   Wt (!) 308 lb (139.7 kg)   BMI 60.15 kg/m   Wt Readings from Last 3 Encounters:  01/09/18 (!) 308 lb (139.7 kg)  10/03/17 295 lb (133.8 kg)  07/22/17 293 lb (132.9 kg)    Physical Exam  Constitutional: She is oriented to person, place, and time. She appears well-developed and well-nourished. No distress.  Well-appearing, comfortable, cooperative, morbid obesity  HENT:  Head: Normocephalic and atraumatic.  Mouth/Throat: Oropharynx is clear and moist.  Eyes: Conjunctivae are normal. Right eye exhibits no discharge. Left eye exhibits no discharge.  Neck: Normal range of motion. Neck supple. No thyromegaly present.  Cardiovascular: Normal rate, regular rhythm, normal heart sounds  and intact distal pulses.  No murmur heard. Pulmonary/Chest: Effort normal and breath sounds normal. No respiratory distress. She has no wheezes. She has no rales.  Musculoskeletal: She exhibits no edema.  LEFT Lower Ankle with tenderness posteriorly over achilles tendon, without edema, erythema or ecchymosis. Non tender over ankle bone malleoli  Lymphadenopathy:    She has no cervical adenopathy.  Neurological: She is alert and oriented to person, place, and time.    Skin: Skin is warm and dry. No rash noted. She is not diaphoretic. No erythema.  Psychiatric: She has a normal mood and affect. Her behavior is normal.  Well groomed, good eye contact, normal speech and thoughts  Nursing note and vitals reviewed.  Results for orders placed or performed in visit on 01/09/18  POCT HgB A1C  Result Value Ref Range   Hemoglobin A1C 6.9 (A) 4.0 - 5.6 %  HM DIABETES EYE EXAM  Result Value Ref Range   HM Diabetic Eye Exam Retinopathy (A) No Retinopathy      Assessment & Plan:   Problem List Items Addressed This Visit    Controlled type 2 diabetes with neuropathy (HCC) - Primary    Worsening DM control now w/ A1c up to 6.9, from 6.2. Seems has stopped taking GLP1 after we switched from Trulicity 1.5 to Ozempic titration, she had confusion due to pharmacy sending her old med trulicity - she did not know which to take and stopped both. Did not notify office Without significant hyperglycemia. No hypoglycemia Still limited exercise due to obesity and L knee OA/instability Complications - CKD-III, peripheral neuropathy, DM retinopathy L only, other including hyperlipidemia, GERD, morbid obesity, OSA - increases risk of future cardiovascular complications   Plan:  1. She may finish current Trulicity 1.5mg  weekly inj until completes her current supply - then she needs to discontinue med at pharmacy - when ready - START existing med at home - Ozempic 0.25mg  weekly for 4 weeks then increase to 0.5mg  weekly inj - reviewed benefits, side effect, and given manufacturer copay discount card today - SWITCH Metformin 1000 mg BID to Metformin ER 750mg  x 2 in AM 1500mg  daily - due to GI intolerance, new rx sent 2. Encourage improved lifestyle - low carb, low sugar diet, reduce portion size, continue improving regular exercise - still limited by knee 3. Check CBG , bring log to next visit for review 4. Continue ASA, ACEi, Statin 5. Follow-up 4 months Annual w/ labs       Relevant Medications   metFORMIN (GLUCOPHAGE-XR) 750 MG 24 hr tablet   Other Relevant Orders   POCT HgB A1C (Completed)   Essential hypertension    Elevated BP, improve on re-check. Mostly controlled on meds Complicated by CKD-III    Plan:  1.  Continue current BP regimen - Lisinopril-HCTZ 20-25mg  daily, Amlodipine 10mg  2. Encourage improved lifestyle - low sodium diet, regular exercise 3. Continue monitor BP outside office, bring readings to next visit, if persistently >140/90 or new symptoms notify office sooner       Other Visit Diagnoses    Needs flu shot       Relevant Orders   Flu Vaccine QUAD 36+ mos IM (Completed)   Tendonitis, Achilles, left     Clinically with localized L ankle posterior achilles tenderness on exam, provoke with activity Suspect tendonitis given no actual injury, not consistent with tear or any deformity, able to ambulate without assistance  Trial of topical diclofenac NSAID PRN RICE therapy Ankle/achilles support Follow-up  Screen for colon cancer     Due for routine colon cancer screening - Discussion today about recommendations for either Colonoscopy or Cologuard screening, benefits and risks of screening, interested in Cologuard, understands that if positive then recommendation is for diagnostic colonoscopy to follow-up. - Ordered Cologuard today - Patient advised to contact insurance to learn cost, she was advised to not complete test if not covered     Relevant Orders   Cologuard      Meds ordered this encounter  Medications  . metFORMIN (GLUCOPHAGE-XR) 750 MG 24 hr tablet    Sig: Take 2 tablets (1,500 mg total) by mouth daily with breakfast.    Dispense:  180 tablet    Refill:  1    DISCONTINUE Metformin regular, switching to XR    Follow up plan: Return in about 4 months (around 05/10/2018) for Annual Physical.  Future labs ordered for 04/2018  Saralyn Pilar, DO [facility] 01/09/2018, 1:00 PM

## 2018-01-09 NOTE — Patient Instructions (Addendum)
Thank you for coming to the office today.  Stop previous metformin 1000mg  twice daily START new Metformin XR 750mg  x 2 either at once a day - or can take 1 in AM and 1 in PM - extended should reduce side effects.  FINISH Trulicity 1.5mg  weekly inj - to use up old stock When finished then start new Ozempic 0.25mg  weekly inj for 4 weeks then inc to weekly 0.5mg  until finish.  Continue BP med,  - re-check BP was okay  For ankle - try diclofenac topical on left achilles tendon,   Use RICE therapy: - R - Rest / relative rest with activity modification avoid overuse of joint - I - Ice packs (make sure you use a towel or sock / something to protect skin) - C - Compression with ANKLE SUPPORT BRACE to apply pressure and reduce swelling allowing more support - E - Elevation - if significant swelling, lift leg above heart level (toes above your nose) to help reduce swelling, most helpful at night after day of being on your feet   For Mammogram screening for breast cancer   Call the Imaging Center below anytime to schedule your own appointment now that order has been placed.  [facility] [address] [place], [place] [address] Phone: [phone]   Please schedule a Follow-up Appointment to: Return in about 4 months (around 05/10/2018) for Annual Physical.  If you have any other questions or concerns, please feel free to call the office or send a message through MyChart. You may also schedule an earlier appointment if necessary.  Additionally, you may be receiving a survey about your experience at our office within a few days to 1 week by e-mail or mail. We value your feedback.  [name] Venus Gilles, DO [facility]

## 2018-01-09 NOTE — Assessment & Plan Note (Signed)
Worsening DM control now w/ A1c up to 6.9, from 6.2. Seems has stopped taking GLP1 after we switched from Trulicity 1.5 to Ozempic titration, she had confusion due to pharmacy sending her old med trulicity - she did not know which to take and stopped both. Did not notify office Without significant hyperglycemia. No hypoglycemia Still limited exercise due to obesity and L knee OA/instability Complications - CKD-III, peripheral neuropathy, DM retinopathy L only, other including hyperlipidemia, GERD, morbid obesity, OSA - increases risk of future cardiovascular complications   Plan:  1. She may finish current Trulicity 1.5mg  weekly inj until completes her current supply - then she needs to discontinue med at pharmacy - when ready - START existing med at home - Ozempic 0.25mg  weekly for 4 weeks then increase to 0.5mg  weekly inj - reviewed benefits, side effect, and given manufacturer copay discount card today - SWITCH Metformin 1000 mg BID to Metformin ER 750mg  x 2 in AM 1500mg  daily - due to GI intolerance, new rx sent 2. Encourage improved lifestyle - low carb, low sugar diet, reduce portion size, continue improving regular exercise - still limited by knee 3. Check CBG , bring log to next visit for review 4. Continue ASA, ACEi, Statin 5. Follow-up 4 months Annual w/ labs

## 2018-01-19 ENCOUNTER — Other Ambulatory Visit: Payer: Self-pay | Admitting: Family Medicine

## 2018-01-19 ENCOUNTER — Ambulatory Visit
Admission: RE | Admit: 2018-01-19 | Discharge: 2018-01-19 | Disposition: A | Discharge status: Home / Self Care | Payer: BC Managed Care – PPO | Source: Ambulatory Visit | Attending: Family Medicine | Admitting: Family Medicine

## 2018-01-19 DIAGNOSIS — Z1239 Encounter for other screening for malignant neoplasm of breast: Secondary | ICD-10-CM

## 2018-02-02 ENCOUNTER — Other Ambulatory Visit: Payer: Self-pay | Admitting: Family Medicine

## 2018-02-02 DIAGNOSIS — N6320 Unspecified lump in the left breast, unspecified quadrant: Secondary | ICD-10-CM

## 2018-02-02 DIAGNOSIS — R928 Other abnormal and inconclusive findings on diagnostic imaging of breast: Secondary | ICD-10-CM

## 2018-02-20 ENCOUNTER — Ambulatory Visit
Admission: RE | Admit: 2018-02-20 | Discharge: 2018-02-20 | Disposition: A | Discharge status: Home / Self Care | Payer: BC Managed Care – PPO | Source: Ambulatory Visit | Attending: Family Medicine | Admitting: Family Medicine

## 2018-02-20 DIAGNOSIS — R928 Other abnormal and inconclusive findings on diagnostic imaging of breast: Secondary | ICD-10-CM | POA: Diagnosis not present

## 2018-02-20 DIAGNOSIS — N6320 Unspecified lump in the left breast, unspecified quadrant: Secondary | ICD-10-CM

## 2018-03-24 IMAGING — CR DG CHEST 2V
1 series · 2 of 2 positions shown · non-contrast
Comparison: None.

CLINICAL DATA: Chest pain.

EXAM:
CHEST  2 VIEW

[Series 1: w chest pa · 0.14mm/px · 2 of 2 slices shown]
[im 1/2]
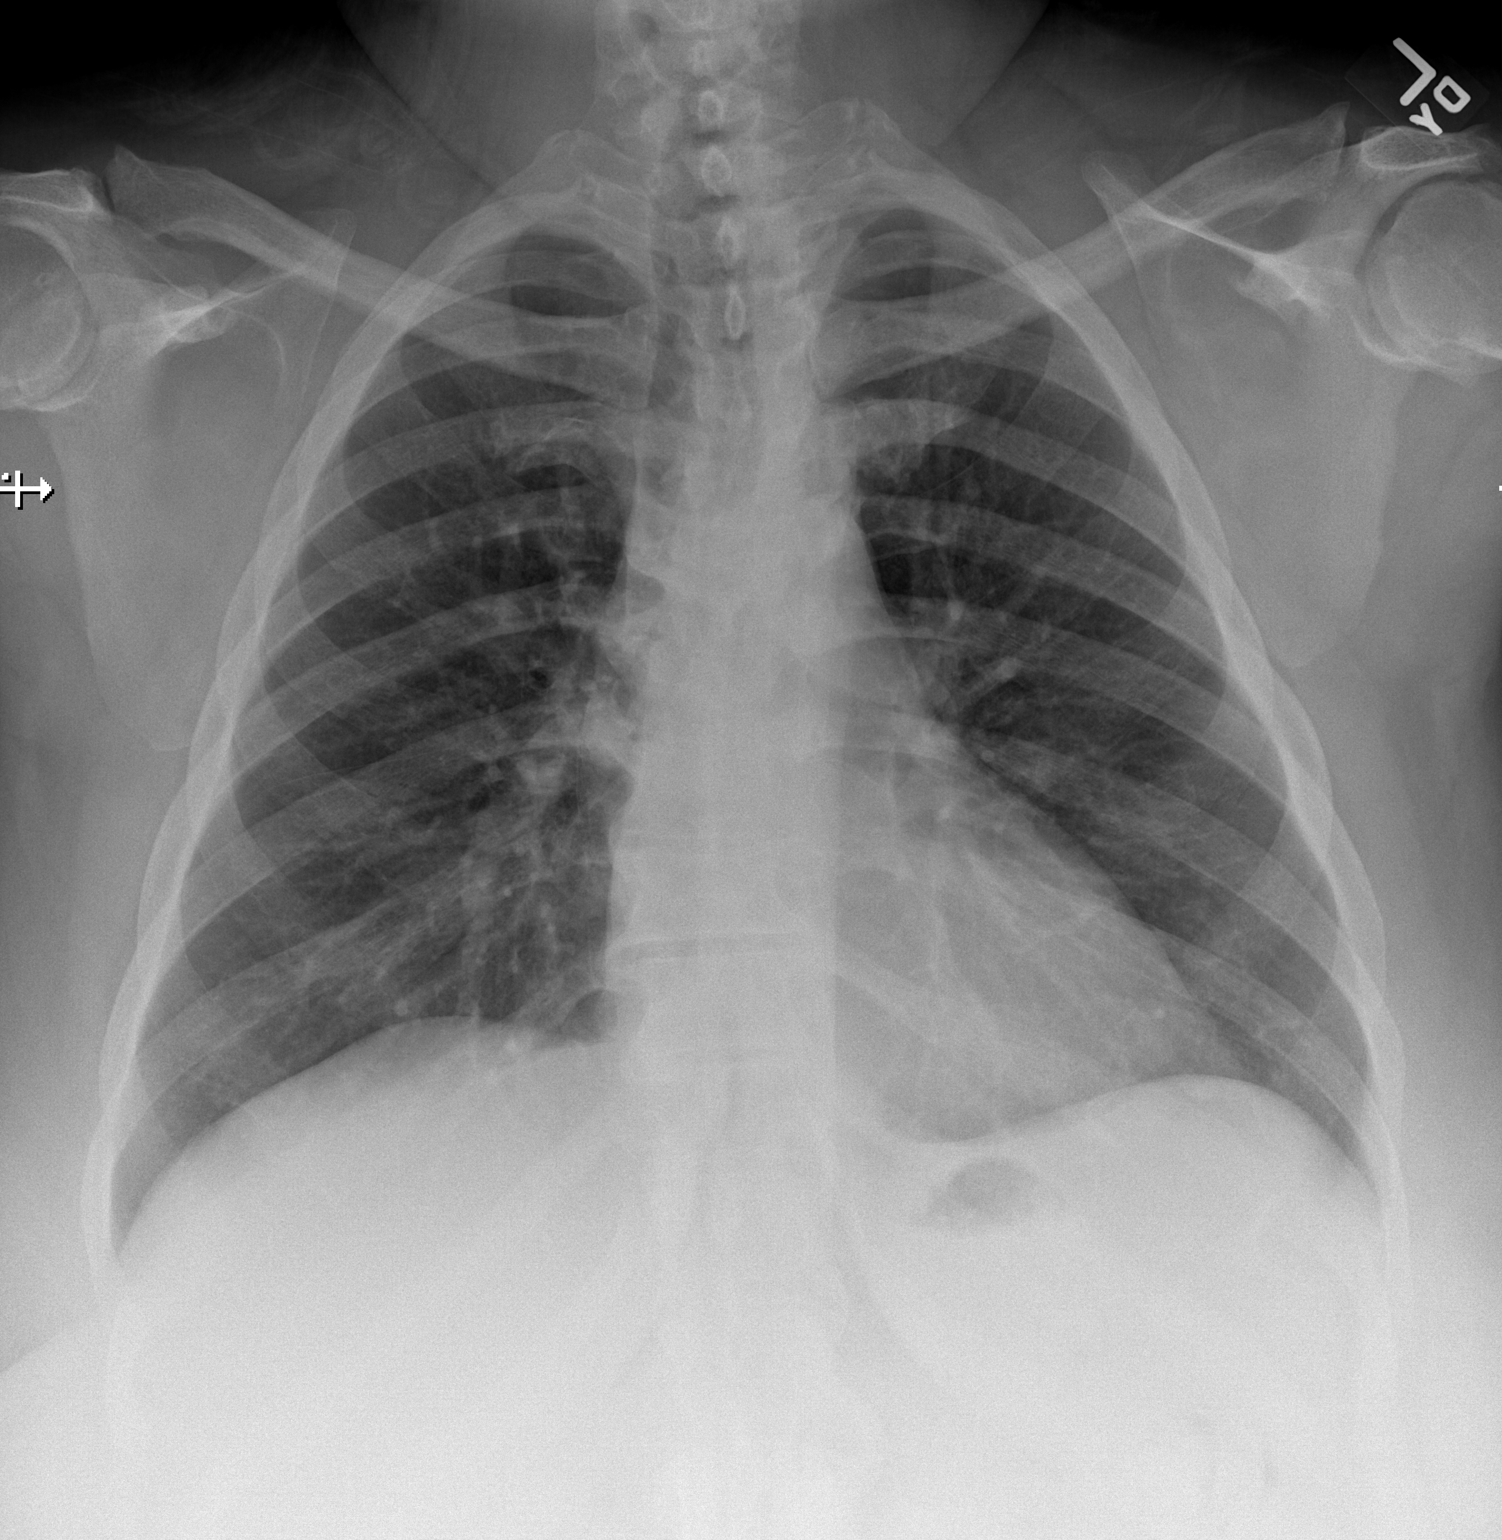
[im 2/2]
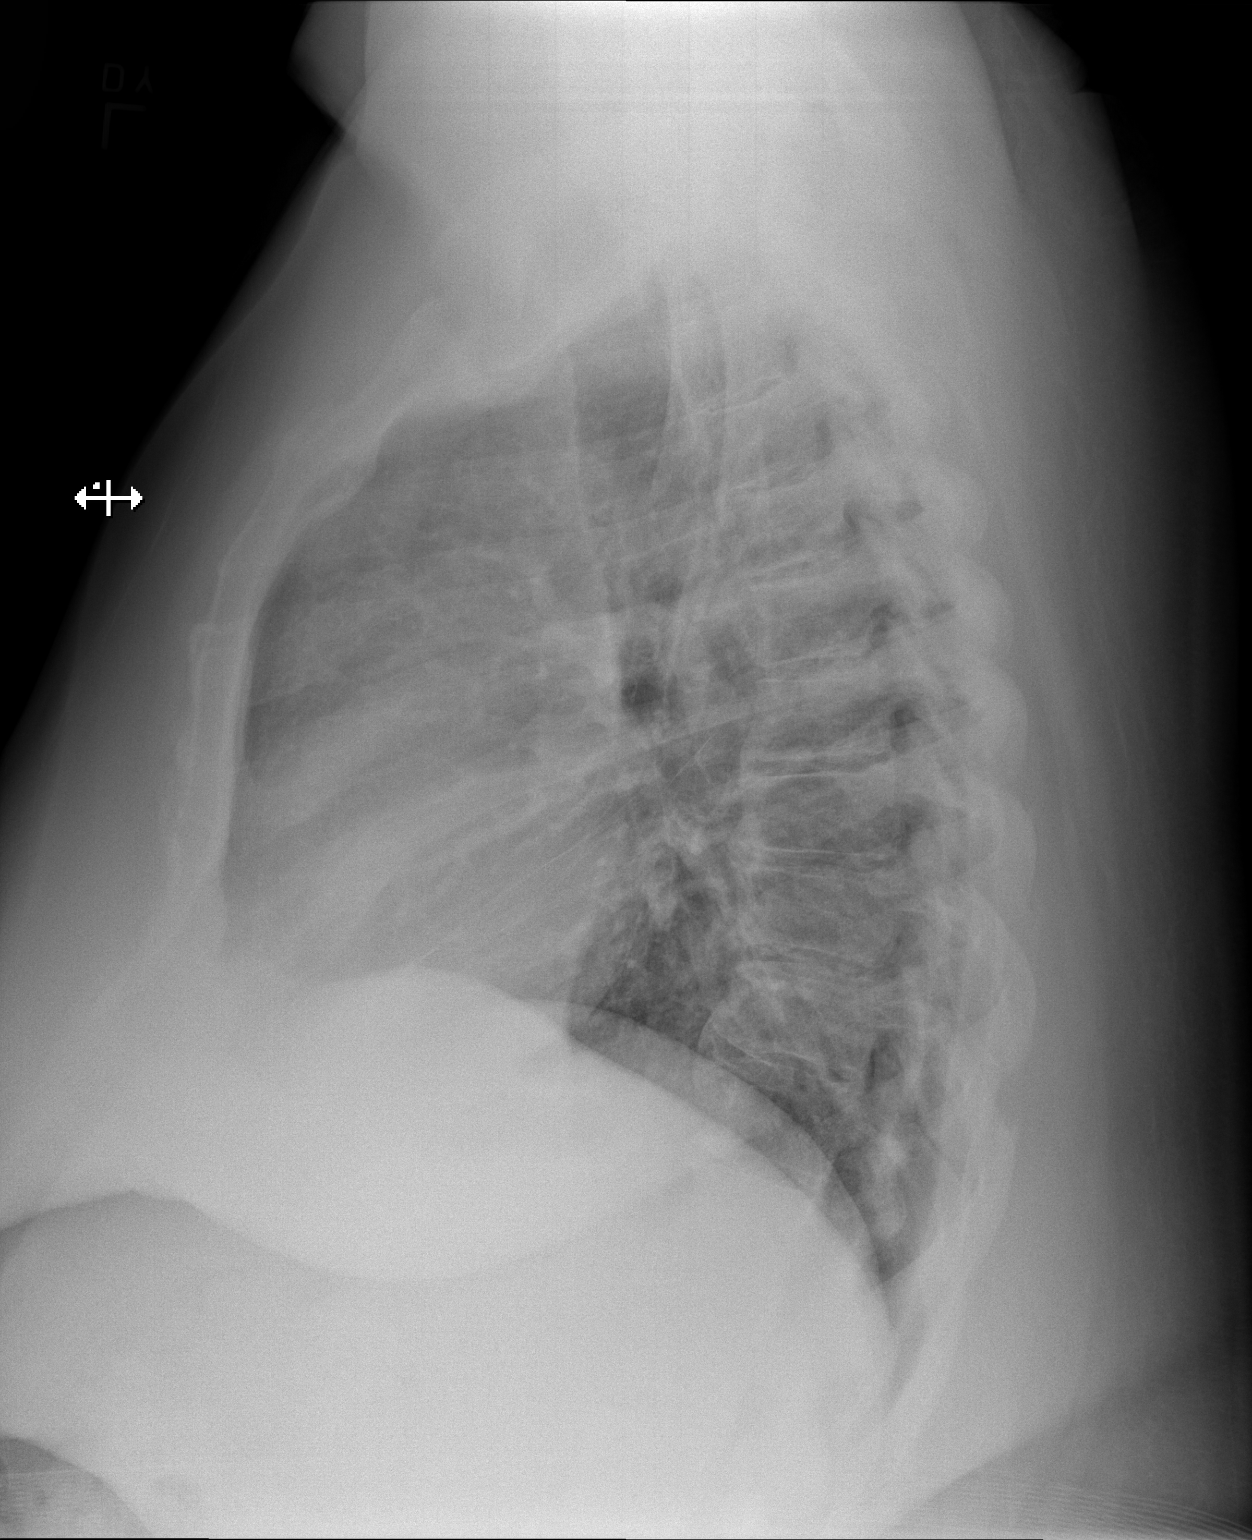

[2 of 2 positions shown; findings below may reference images not displayed]

FINDINGS: The heart size and mediastinal contours are within normal limits.
There is no evidence of pulmonary edema, consolidation,
pneumothorax, nodule or pleural fluid. The thoracic spine shows mild
spondylosis.
IMPRESSION: No active cardiopulmonary disease.

## 2018-04-11 ENCOUNTER — Other Ambulatory Visit: Payer: Self-pay | Admitting: Family Medicine

## 2018-04-11 DIAGNOSIS — I10 Essential (primary) hypertension: Secondary | ICD-10-CM

## 2018-04-17 ENCOUNTER — Other Ambulatory Visit: Payer: BC Managed Care – PPO

## 2018-04-17 DIAGNOSIS — Z0000 Encounter for general adult medical examination without abnormal findings: Secondary | ICD-10-CM

## 2018-04-17 DIAGNOSIS — D509 Iron deficiency anemia, unspecified: Secondary | ICD-10-CM

## 2018-04-17 DIAGNOSIS — E1169 Type 2 diabetes mellitus with other specified complication: Secondary | ICD-10-CM

## 2018-04-17 DIAGNOSIS — N183 Chronic kidney disease, stage 3 unspecified: Secondary | ICD-10-CM

## 2018-04-17 DIAGNOSIS — I10 Essential (primary) hypertension: Secondary | ICD-10-CM

## 2018-04-17 DIAGNOSIS — E785 Hyperlipidemia, unspecified: Secondary | ICD-10-CM

## 2018-04-17 DIAGNOSIS — Z6841 Body Mass Index (BMI) 40.0 and over, adult: Secondary | ICD-10-CM

## 2018-04-17 DIAGNOSIS — E1140 Type 2 diabetes mellitus with diabetic neuropathy, unspecified: Secondary | ICD-10-CM

## 2018-04-18 LAB — IRON,TIBC AND FERRITIN PANEL
%SAT: 17 % (calc) (ref 16–45)
Ferritin: 128 ng/mL (ref 16–232)
Iron: 52 ug/dL (ref 45–160)
TIBC: 311 mcg/dL (calc) (ref 250–450)

## 2018-04-18 LAB — CBC WITH DIFFERENTIAL/PLATELET
Absolute Monocytes: 447 cells/uL (ref 200–950)
Basophils Absolute: 26 cells/uL (ref 0–200)
Basophils Relative: 0.3 %
Eosinophils Absolute: 224 cells/uL (ref 15–500)
Eosinophils Relative: 2.6 %
HCT: 32.6 % — ABNORMAL LOW (ref 35.0–45.0)
Hemoglobin: 10.2 g/dL — ABNORMAL LOW (ref 11.7–15.5)
LYMPHS ABS: 1944 {cells}/uL (ref 850–3900)
MCH: 27 pg (ref 27.0–33.0)
MCHC: 31.3 g/dL — ABNORMAL LOW (ref 32.0–36.0)
MCV: 86.2 fL (ref 80.0–100.0)
MPV: 11.5 fL (ref 7.5–12.5)
Monocytes Relative: 5.2 %
NEUTROS PCT: 69.3 %
Neutro Abs: 5960 cells/uL (ref 1500–7800)
Platelets: 386 10*3/uL (ref 140–400)
RBC: 3.78 10*6/uL — ABNORMAL LOW (ref 3.80–5.10)
RDW: 13.4 % (ref 11.0–15.0)
Total Lymphocyte: 22.6 %
WBC: 8.6 10*3/uL (ref 3.8–10.8)

## 2018-04-18 LAB — COMPLETE METABOLIC PANEL WITH GFR
AG Ratio: 1.1 (calc) (ref 1.0–2.5)
ALT: 10 U/L (ref 6–29)
AST: 9 U/L — ABNORMAL LOW (ref 10–35)
Albumin: 3.9 g/dL (ref 3.6–5.1)
Alkaline phosphatase (APISO): 91 U/L (ref 37–153)
BUN/Creatinine Ratio: 21 (calc) (ref 6–22)
BUN: 44 mg/dL — ABNORMAL HIGH (ref 7–25)
CHLORIDE: 105 mmol/L (ref 98–110)
CO2: 27 mmol/L (ref 20–32)
Calcium: 9.9 mg/dL (ref 8.6–10.4)
Creat: 2.08 mg/dL — ABNORMAL HIGH (ref 0.50–1.05)
GFR, Est African American: 31 mL/min/{1.73_m2} — ABNORMAL LOW (ref >=60)
GFR, Est Non African American: 26 mL/min/{1.73_m2} — ABNORMAL LOW (ref >=60)
Globulin: 3.5 g/dL (calc) (ref 1.9–3.7)
Glucose, Bld: 207 mg/dL — ABNORMAL HIGH (ref 65–139)
Potassium: 4.8 mmol/L (ref 3.5–5.3)
Sodium: 141 mmol/L (ref 135–146)
Total Bilirubin: 0.2 mg/dL (ref 0.2–1.2)
Total Protein: 7.4 g/dL (ref 6.1–8.1)

## 2018-04-18 LAB — LIPID PANEL
Cholesterol: 136 mg/dL (ref <200)
HDL: 30 mg/dL — ABNORMAL LOW (ref >=50)
LDL Cholesterol (Calc): 79 mg/dL (calc)
NON-HDL CHOLESTEROL (CALC): 106 mg/dL (ref <130)
Total CHOL/HDL Ratio: 4.5 (calc) (ref <5.0)
Triglycerides: 171 mg/dL — ABNORMAL HIGH (ref <150)

## 2018-04-18 LAB — HEMOGLOBIN A1C
EAG (MMOL/L): 7.6 (calc)
Hgb A1c MFr Bld: 6.4 % of total Hgb — ABNORMAL HIGH (ref <5.7)
Mean Plasma Glucose: 137 (calc)

## 2018-04-24 ENCOUNTER — Ambulatory Visit (INDEPENDENT_AMBULATORY_CARE_PROVIDER_SITE_OTHER): Payer: BC Managed Care – PPO | Admitting: Family Medicine

## 2018-04-24 ENCOUNTER — Other Ambulatory Visit: Payer: Self-pay | Admitting: Family Medicine

## 2018-04-24 ENCOUNTER — Encounter: Payer: Self-pay | Admitting: Family Medicine

## 2018-04-24 VITALS — BP 129/61 | HR 63 | Temp 98.2°F | Resp 16 | Ht 60.0 in | Wt 302.8 lb

## 2018-04-24 DIAGNOSIS — Z6841 Body Mass Index (BMI) 40.0 and over, adult: Principal | ICD-10-CM

## 2018-04-24 DIAGNOSIS — Z01818 Encounter for other preprocedural examination: Secondary | ICD-10-CM

## 2018-04-24 DIAGNOSIS — G5601 Carpal tunnel syndrome, right upper limb: Secondary | ICD-10-CM | POA: Insufficient documentation

## 2018-04-24 DIAGNOSIS — E1140 Type 2 diabetes mellitus with diabetic neuropathy, unspecified: Secondary | ICD-10-CM | POA: Diagnosis not present

## 2018-04-24 DIAGNOSIS — I10 Essential (primary) hypertension: Secondary | ICD-10-CM | POA: Diagnosis not present

## 2018-04-24 DIAGNOSIS — Z0000 Encounter for general adult medical examination without abnormal findings: Secondary | ICD-10-CM

## 2018-04-24 DIAGNOSIS — N183 Chronic kidney disease, stage 3 unspecified: Secondary | ICD-10-CM

## 2018-04-24 DIAGNOSIS — E1169 Type 2 diabetes mellitus with other specified complication: Secondary | ICD-10-CM

## 2018-04-24 DIAGNOSIS — E785 Hyperlipidemia, unspecified: Secondary | ICD-10-CM

## 2018-04-24 DIAGNOSIS — D509 Iron deficiency anemia, unspecified: Secondary | ICD-10-CM

## 2018-04-24 NOTE — Progress Notes (Signed)
Subjective:    Patient ID: Ashley Terry, female    DOB: [birth year], 53 y.o.   MRN: [id number]  Dereona Kolodny is a 53 y.o. female presenting on 04/24/2018 for Annual Exam   HPI   Here for Annual Physical and Lab Review.  CHRONIC DM, Type 2 with nephropathy Last A1c down to 6.4, improved. She feels like her sugar is better controlled on current med regimen. CBGs:Similar avg to last time -Avg120, Low>100, High<170. Checks CBGs x 1 daily AM fasting Meds: Metformin 1072m twice a day - ozempic 0.577mweekly injection - Metformin XR 75028m 2 = 1500 daily Currently on ACEi Known DM Retinopathy L only mild non-prolif, w/ macular edema, not affecting R eye -[facility] last visit 08/2017 Lifestyle: - Diet: Improved diet now, low carb, reduce portions - Limited exercise Denies hypoglycemia  CHRONIC HTN with CKD Previously has done well, home BP readings normal, using wrist cuff Current Meds - Lisinopril-HCTZ 20-67m89mmlodipine 10mg52mly Reports good compliance, took meds today. 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 Next apt with Dr LateeHolley Raring next week on Wednesday, due to elevated Creatinine 2.08 and reduced GFR < 30 on last lab. She was taken off diuretic furosemide as a result.  HYPERLIPIDEMIA: - Reports no concerns. Last lipid panel 03/2018, mostly controlled  - Currently taking Atorvastatin 40mg,61merating well without side effects or myalgias  Anemia Last lab shows still low Hemoglobin but normal iron studies.  Additional questions  Right Carpal Tunnel Syndrome - Right hand and fingers with some numbness and tingling in fingertips. Has not had nerve testing before or treatment. Not wearing splint or other therapy.  Knee Pain, Orthopedic - reports arthritis still bothering her now, worse at times with flare up, she is interested to return to Ortho for steroid injection. [facility]   Health Maintenance:  Colon CA Screening: She  completed this in December 2020, but specimen sample was not able to be processed, do not know what this error was, gave her printed result and she will call customer support, may get new kit to re-send.  Due for pap smear, cervical cancer screening - she never scheduled w/ [facility] last time referred in 2019. I advised her to re-schedule w/ them and ask if she needs new referral.   Depression screen PHQ 2/[facility]/28/2020 01/09/2018 10/03/2017  Decreased Interest 0 0 0  Down, Depressed, Hopeless 0 0 0  PHQ - 2 Score 0 0 0    Past Medical History:  Diagnosis Date  . Asthma   . Dyspnea    with exertion  . Frequent headaches   . GERD (gastroesophageal reflux disease)    takes protonix  . Seasonal allergies   . Sleep apnea    CPAP   Past Surgical History:  Procedure Laterality Date  . CESAREAN SECTION  1980  . CESAREAN SECTION  1992  . CHOLECYSTECTOMY N/A 04/16/2016   Procedure: LAPAROSCOPIC CHOLECYSTECTOMY;  Surgeon: RicharFlorene Glen Location: [facility] ORS;  Service: General;  Laterality: N/A;  . TONSILECTOMY, ADENOIDECTOMY, BILATERAL MYRINGOTOMY AND TUBES  1980  . TUBAL LIGATION  1994   Social History   Socioeconomic History  . Marital status: Widowed    Spouse name: Not on file  . Number of children: Not on file  . Years of education: Not on file  . Highest education level: Not on file  Occupational History  . Occupation: AdminiLobbyistial Needs  .  Financial resource strain: Not on file  . Food insecurity:    Worry: Not on file    Inability: Not on file  . Transportation needs:    Medical: Not on file    Non-medical: Not on file  Tobacco Use  . Smoking status: Never Smoker  . Smokeless tobacco: Never Used  Substance and Sexual Activity  . Alcohol use: No  . Drug use: No  . Sexual activity: Never  Lifestyle  . Physical activity:    Days per week: Not on file    Minutes per session: Not on file  . Stress: Not on file    Relationships  . Social connections:    Talks on phone: Not on file    Gets together: Not on file    Attends religious service: Not on file    Active member of club or organization: Not on file    Attends meetings of clubs or organizations: Not on file    Relationship status: Not on file  . Intimate partner violence:    Fear of current or ex partner: Not on file    Emotionally abused: Not on file    Physically abused: Not on file    Forced sexual activity: Not on file  Other Topics Concern  . Not on file  Social History Narrative  . Not on file   Family History  Problem Relation Age of Onset  . Diabetes Mother   . Hypertension Mother   . Stroke Father   . Cancer Father        Amyloid  . Pancreatic cancer Maternal Grandmother   . Breast cancer Paternal Grandmother   . Prostate cancer Maternal Grandfather   . Cancer Paternal Grandfather        unknown   Current Outpatient Medications on File Prior to Visit  Medication Sig  . acetaminophen (TYLENOL) 500 MG tablet Take 500 mg by mouth every 6 (six) hours as needed for moderate pain.  Marland Kitchen amLODipine (NORVASC) 10 MG tablet TAKE 1 TABLET BY MOUTH EVERY DAY  . aspirin EC 81 MG tablet Take 81 mg by mouth daily.  Marland Kitchen aspirin-acetaminophen-caffeine (EXCEDRIN MIGRAINE) 250-250-65 MG tablet Take 2 tablets by mouth every 6 (six) hours as needed for headache or migraine.  Marland Kitchen atorvastatin (LIPITOR) 40 MG tablet Take 1 tablet (40 mg total) by mouth at bedtime.  . diclofenac sodium (VOLTAREN) 1 % GEL Apply 2 g topically 3 (three) times daily as needed (knee pain, arthritis).  Marland Kitchen lisinopril-hydrochlorothiazide (PRINZIDE,ZESTORETIC) 20-25 MG tablet Take 1 tablet by mouth daily.  Marland Kitchen MEGARED OMEGA-3 KRILL OIL 500 MG CAPS Take 500 mg by mouth daily.  . metFORMIN (GLUCOPHAGE-XR) 750 MG 24 hr tablet Take 2 tablets (1,500 mg total) by mouth daily with breakfast.  . pantoprazole (PROTONIX) 40 MG tablet TAKE 1 TABLET (40 MG TOTAL) BY MOUTH DAILY. 30 MIN  BEFORE FIRST MEAL OF DAY  . Semaglutide (OZEMPIC) 0.25 or 0.5 MG/DOSE SOPN Inject 0.5 mg into the skin once a week. For first 4 weeks, inject dose 0.41m weekly  . Vitamin D, Ergocalciferol, (DRISDOL) 50000 units CAPS capsule TAKE 1 CAP ONCE A WEEK FOR 8 WEEKS   No current facility-administered medications on file prior to visit.     Review of Systems  Constitutional: Negative for activity change, appetite change, chills, diaphoresis, fatigue and fever.  HENT: Negative for congestion and hearing loss.   Eyes: Negative for visual disturbance.  Respiratory: Negative for apnea, cough, chest tightness, shortness of breath  and wheezing.   Cardiovascular: Negative for chest pain, palpitations and leg swelling.  Gastrointestinal: Negative for abdominal pain, anal bleeding, blood in stool, constipation, diarrhea, nausea and vomiting.  Endocrine: Negative for cold intolerance.  Genitourinary: Negative for difficulty urinating, dysuria, frequency and hematuria.  Musculoskeletal: Positive for arthralgias (bilateral knees). Negative for back pain and neck pain.  Skin: Negative for rash.  Allergic/Immunologic: Negative for environmental allergies.  Neurological: Positive for numbness (finger tips R hand). Negative for dizziness, weakness, light-headedness and headaches.  Hematological: Negative for adenopathy.  Psychiatric/Behavioral: Negative for behavioral problems, dysphoric mood and sleep disturbance. The patient is not nervous/anxious.    Per HPI unless specifically indicated above      Objective:    BP 129/61   Pulse 63   Temp 98.2 F (36.8 C) (Oral)   Resp 16   Ht 5' (1.524 m)   Wt (!) 302 lb 12.8 oz (137.3 kg)   BMI 59.14 kg/m   Wt Readings from Last 3 Encounters:  04/24/18 (!) 302 lb 12.8 oz (137.3 kg)  01/09/18 (!) 308 lb (139.7 kg)  10/03/17 295 lb (133.8 kg)    Physical Exam Vitals signs and nursing note reviewed.  Constitutional:      General: She is not in acute  distress.    Appearance: She is well-developed. She is not diaphoretic.     Comments: Well-appearing, comfortable, cooperative, morbidly obese  HENT:     Head: Normocephalic and atraumatic.  Eyes:     General:        Right eye: No discharge.        Left eye: No discharge.     Conjunctiva/sclera: Conjunctivae normal.     Pupils: Pupils are equal, round, and reactive to light.  Neck:     Musculoskeletal: Normal range of motion and neck supple.     Thyroid: No thyromegaly.  Cardiovascular:     Rate and Rhythm: Normal rate and regular rhythm.     Heart sounds: Normal heart sounds. No murmur.  Pulmonary:     Effort: Pulmonary effort is normal. No respiratory distress.     Breath sounds: Normal breath sounds. No wheezing or rales.  Abdominal:     General: Bowel sounds are normal. There is no distension.     Palpations: Abdomen is soft. There is no mass.     Tenderness: There is no abdominal tenderness.  Musculoskeletal:        General: No tenderness.     Comments: Upper / Lower Extremities: - Normal muscle tone, strength bilateral upper extremities 5/5, lower extremities 5/5  Lymphadenopathy:     Cervical: No cervical adenopathy.  Skin:    General: Skin is warm and dry.     Findings: No erythema or rash.  Neurological:     Mental Status: She is alert and oriented to person, place, and time.     Comments: Distal sensation intact to light touch all extremities  Psychiatric:        Behavior: Behavior normal.     Comments: Well groomed, good eye contact, normal speech and thoughts      Diabetic Foot Exam - Simple   Simple Foot Form Diabetic Foot exam was performed with the following findings:  Yes 04/24/2018  8:35 AM  Visual Inspection See comments:  Yes Sensation Testing Intact to touch and monofilament testing bilaterally:  Yes Pulse Check Posterior Tibialis and Dorsalis pulse intact bilaterally:  Yes Comments Left foot mild callus formation posterior foot heel, slight  monofilament reduced sensation.      Recent Labs    10/03/17 0959 01/09/18 0855 04/17/18 1139  HGBA1C 6.2* 6.9* 6.4*    Results for orders placed or performed in visit on 04/17/18  Lipid panel  Result Value Ref Range   Cholesterol 136 <200 mg/dL   HDL 30 (L) > OR = 50 mg/dL   Triglycerides 171 (H) <150 mg/dL   LDL Cholesterol (Calc) 79 mg/dL (calc)   Total CHOL/HDL Ratio 4.5 <5.0 (calc)   Non-HDL Cholesterol (Calc) 106 <130 mg/dL (calc)  COMPLETE METABOLIC PANEL WITH GFR  Result Value Ref Range   Glucose, Bld 207 (H) 65 - 139 mg/dL   BUN 44 (H) 7 - 25 mg/dL   Creat 2.08 (H) 0.50 - 1.05 mg/dL   GFR, Est Non African American 26 (L) > OR = 60 mL/min/1.88m   GFR, Est African American 31 (L) > OR = 60 mL/min/1.740m  BUN/Creatinine Ratio 21 6 - 22 (calc)   Sodium 141 135 - 146 mmol/L   Potassium 4.8 3.5 - 5.3 mmol/L   Chloride 105 98 - 110 mmol/L   CO2 27 20 - 32 mmol/L   Calcium 9.9 8.6 - 10.4 mg/dL   Total Protein 7.4 6.1 - 8.1 g/dL   Albumin 3.9 3.6 - 5.1 g/dL   Globulin 3.5 1.9 - 3.7 g/dL (calc)   AG Ratio 1.1 1.0 - 2.5 (calc)   Total Bilirubin 0.2 0.2 - 1.2 mg/dL   Alkaline phosphatase (APISO) 91 37 - 153 U/L   AST 9 (L) 10 - 35 U/L   ALT 10 6 - 29 U/L  CBC with Differential/Platelet  Result Value Ref Range   WBC 8.6 3.8 - 10.8 Thousand/uL   RBC 3.78 (L) 3.80 - 5.10 Million/uL   Hemoglobin 10.2 (L) 11.7 - 15.5 g/dL   HCT 32.6 (L) 35.0 - 45.0 %   MCV 86.2 80.0 - 100.0 fL   MCH 27.0 27.0 - 33.0 pg   MCHC 31.3 (L) 32.0 - 36.0 g/dL   RDW 13.4 11.0 - 15.0 %   Platelets 386 140 - 400 Thousand/uL   MPV 11.5 7.5 - 12.5 fL   Neutro Abs 5,960 1,500 - 7,800 cells/uL   Lymphs Abs 1,944 850 - 3,900 cells/uL   Absolute Monocytes 447 200 - 950 cells/uL   Eosinophils Absolute 224 15 - 500 cells/uL   Basophils Absolute 26 0 - 200 cells/uL   Neutrophils Relative % 69.3 %   Total Lymphocyte 22.6 %   Monocytes Relative 5.2 %   Eosinophils Relative 2.6 %   Basophils  Relative 0.3 %  Hemoglobin A1c  Result Value Ref Range   Hgb A1c MFr Bld 6.4 (H) <5.7 % of total Hgb   Mean Plasma Glucose 137 (calc)   eAG (mmol/L) 7.6 (calc)  Iron, TIBC and Ferritin Panel  Result Value Ref Range   Iron 52 45 - 160 mcg/dL   TIBC 311 250 - 450 mcg/dL (calc)   %SAT 17 16 - 45 % (calc)   Ferritin 128 16 - 232 ng/mL      Assessment & Plan:   Problem List Items Addressed This Visit    Anemia    Likely secondary to kidney function Normal iron panel Follow-up with Nephrology      CKD (chronic kidney disease), stage III (HCC)    Possible progression to CKD-IV now on last lab Cr elevated Secondary to DM, HTN, history of NSAID Followed by [facility] Dr LaHolley Raringnext  apt in 1 week due to reduced GFR < 30 now Had prior negative work up for CKD  On ACEi - will defer this to Nephrology for adjusting or stopping if indicated  Concern with metformin as well, now if GFR < 30 - await recommendations from Nephrology next week, requested record      Controlled type 2 diabetes with neuropathy ([place])    Stable well controlled DM now at A1c 6.4, improved on GLP1 Without significant hyperglycemia. 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 Ozempic 0.22m weekly 2. Metformin XR 7546mx 2 =150096maily 2. Encourage improved lifestyle - low carb, low sugar diet, reduce portion size, continue improving regular exercise - still limited by knee 3. Check CBG , bring log to next visit for review 4. Continue ASA, ACEi, Statin 5. Follow-up  3 months A1c      Essential hypertension    Improved BP, HTN controlled Complicated by CKD-III-IV - progressive decline in kidney function now    Plan:  - [facility] - [facility] have held her  diuretic recently and re-check her next week - Defer management of ACEi to nephrology at this time  Prior meds - can continue current BP regimen - Lisinopril-HCTZ 20-7m71mily, Amlodipine 10mg34mEncourage improved lifestyle - low sodium diet, regular exercise 3. Continue monitor BP outside office, bring readings to next visit, if persistently >140/90 or new symptoms notify office sooner      Hyperlipidemia associated with type 2 diabetes mellitus (HCC) [place] improving lifestyle Last lipid panel 03/2018 Calculated ASCVD 10 yr risk score  Plan: 1. Continue current meds - Atorvastatin 40mg 66my 2. Continue ASA 81mg f53mrimary ASCVD risk reduction 3. Encourage improved lifestyle - low carb/cholesterol, reduce portion size, continue improving regular exercise      Morbid obesity with BMI of 50.0-59.9, adult (HCC)    Gradually improving weight loss, now down 6 lbs in 3-4 months On GLP1 Improving diet Still limited by exercise      Right carpal tunnel syndrome    Persistent symptoms of tingling numb in fingertips, suspected carpal tunnel, repetitive use of hand  Monitor for now, trial of wrist splint for support If not improving can refer to Neuro for NCS and eval may consider injection vs procedure if confirmed       Other Visit Diagnoses    Annual physical exam    -  Primary      Updated Health Maintenance information Reviewed recent lab results with patient Encouraged improvement to lifestyle with diet and exercise - Goal of weight loss   No orders of the defined types were placed in this encounter.   Follow up plan: Return in about 3 months (around 07/23/2018) for DM A1c, HTN, CKD updates.  [facility] 04/24/2018, 8:11 AM

## 2018-04-24 NOTE — Assessment & Plan Note (Addendum)
Improved BP, HTN controlled Complicated by CKD-III-IV - progressive decline in kidney function now    Plan:  - Also managed by [facility] - they have held her diuretic recently and re-check her next week - Defer management of ACEi to nephrology at this time  Prior meds - can continue current BP regimen - Lisinopril-HCTZ 20-25mg  daily, Amlodipine 10mg  2. Encourage improved lifestyle - low sodium diet, regular exercise 3. Continue monitor BP outside office, bring readings to next visit, if persistently >140/90 or new symptoms notify office sooner

## 2018-04-24 NOTE — Assessment & Plan Note (Addendum)
Persistent symptoms of tingling numb in fingertips, suspected carpal tunnel, repetitive use of hand  Monitor for now, trial of wrist splint for support If not improving can refer to Neuro for NCS and eval may consider injection vs procedure if confirmed

## 2018-04-24 NOTE — Patient Instructions (Addendum)
Thank you for coming to the office today.  Recent Labs    10/03/17 0959 01/09/18 0855 04/17/18 1139  HGBA1C 6.2* 6.9* 6.4*    Sugar is improved and BP is stable. Keep on current meds  Follow-up with Dr Cherylann Ratel for kidney function, make sure he can send me a copy of the last report.  Recommend return to Orthopedic for knee injections as needed  In future - if R wrist nerve symptoms get worse - we can refer to Neurology for nerve test and possible injection/procedure  Call to schedule - let me know if need new referral - for pap smear  [facility] [address] [place], [place] [address] Hours: 8am - 5pm Main: [phone]  Please schedule a Follow-up Appointment to: Return in about 3 months (around 07/23/2018) for DM A1c, HTN, CKD updates.  If you have any other questions or concerns, please feel free to call the office or send a message through MyChart. You may also schedule an earlier appointment if necessary.  Additionally, you may be receiving a survey about your experience at our office within a few days to 1 week by e-mail or mail. We value your feedback.  Saralyn Pilar, DO [facility], [place]

## 2018-04-24 NOTE — Assessment & Plan Note (Signed)
Possible progression to CKD-IV now on last lab Cr elevated Secondary to DM, HTN, history of NSAID Followed by [facility] Dr Cherylann Ratel, next apt in 1 week due to reduced GFR < 30 now Had prior negative work up for CKD  On ACEi - will defer this to Nephrology for adjusting or stopping if indicated  Concern with metformin as well, now if GFR < 30 - await recommendations from Nephrology next week, requested record

## 2018-04-24 NOTE — Assessment & Plan Note (Signed)
Gradually improving weight loss, now down 6 lbs in 3-4 months On GLP1 Improving diet Still limited by exercise

## 2018-04-24 NOTE — Assessment & Plan Note (Signed)
Stable well controlled DM now at A1c 6.4, improved on GLP1 Without significant hyperglycemia. 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 Ozempic 0.5mg  weekly 2. Metformin XR 750mg  x 2 =1500mg  daily 2. Encourage improved lifestyle - low carb, low sugar diet, reduce portion size, continue improving regular exercise - still limited by knee 3. Check CBG , bring log to next visit for review 4. Continue ASA, ACEi, Statin 5. Follow-up  3 months A1c

## 2018-04-24 NOTE — Assessment & Plan Note (Signed)
Likely secondary to kidney function Normal iron panel Follow-up with Nephrology

## 2018-04-24 NOTE — Assessment & Plan Note (Signed)
Controlled cholesterol on statin improving lifestyle Last lipid panel 03/2018 Calculated ASCVD 10 yr risk score  Plan: 1. Continue current meds - Atorvastatin 40mg  daily 2. Continue ASA 81mg  for primary ASCVD risk reduction 3. Encourage improved lifestyle - low carb/cholesterol, reduce portion size, continue improving regular exercise

## 2018-05-01 ENCOUNTER — Encounter: Payer: BC Managed Care – PPO | Admitting: Family Medicine

## 2018-05-25 ENCOUNTER — Telehealth: Payer: Self-pay | Admitting: *Deleted

## 2018-05-25 NOTE — Telephone Encounter (Signed)
Left a message for the patient to call back concerning her appointment for tomorrow to either do an evisit or reschedule due to covid precautions.

## 2018-05-26 ENCOUNTER — Other Ambulatory Visit: Payer: Self-pay

## 2018-05-26 ENCOUNTER — Telehealth (INDEPENDENT_AMBULATORY_CARE_PROVIDER_SITE_OTHER): Payer: BC Managed Care – PPO | Admitting: Cardiovascular Disease

## 2018-05-26 ENCOUNTER — Encounter: Payer: Self-pay | Admitting: Cardiovascular Disease

## 2018-05-26 VITALS — BP 138/93 | Ht 60.0 in | Wt 300.0 lb

## 2018-05-26 DIAGNOSIS — I10 Essential (primary) hypertension: Secondary | ICD-10-CM

## 2018-05-26 DIAGNOSIS — Z01810 Encounter for preprocedural cardiovascular examination: Secondary | ICD-10-CM | POA: Diagnosis not present

## 2018-05-26 NOTE — Patient Instructions (Signed)
Medication Instructions:  No changes If you need a refill on your cardiac medications before your next appointment, please call your pharmacy.   Lab work: None If you have labs (blood work) drawn today and your tests are completely normal, you will receive your results only by: Marland Kitchen MyChart Message (if you have MyChart) OR . A paper copy in the mail If you have any lab test that is abnormal or we need to change your treatment, we will call you to review the results.  Testing/Procedures: None  Follow-Up: As needed Any Other Special Instructions Will Be Listed Below (If Applicable).

## 2018-05-26 NOTE — Telephone Encounter (Signed)
Spoke with the patient in regards to her appointment today with Dr. Kirke Corin. The patient needs cardiac clearance for a bariatric surgery with [facility]. The date is pending her cardiac clearance.  She has been advised that we are limiting office visits right now due Covid. She stated that she would be willing to do a telephone visit if this would be possible but needs the clearance because as of right now the surgery is still planned.

## 2018-05-26 NOTE — Telephone Encounter (Signed)
Left a message to call back that the patient will need to do a web visit or reschedule the appointment.

## 2018-05-26 NOTE — Progress Notes (Signed)
Virtual Visit via Video Note    Evaluation Performed:  Follow-up visit  This visit type was conducted due to national recommendations for restrictions regarding the COVID-19 Pandemic (e.g. social distancing).  This format is felt to be most appropriate for this patient at this time.  All issues noted in this document were discussed and addressed.  No physical exam was performed (except for noted visual exam findings with Video Visits).  Please refer to the patient's chart (MyChart message for video visits and phone note for telephone visits) for the patient's consent to telehealth for [facility].  Date:  05/26/2018   ID:  Ashley Terry, DOB [birth year], MRN [id number]  Patient Location:    Provider location:   [facility]  PCP:  Smitty Cords, DO  Cardiologist:  No primary care provider on file.  Electrophysiologist:  None   Chief Complaint: Preop cardiovascular evaluation for bariatric surgery.  History of Present Illness:    Ashley Terry is a 53 y.o. female who presents via Web designer for a telehealth visit today.  She was referred by Dr. Lorrine Kin for preoperative cardiovascular evaluation before bariatric surgery.  She has history of essential hypertension, morbid obesity, anemia, stage III chronic kidney disease and type 2 diabetes. She was seen by Dr. Alvino Chapel in 2017 and 2018 for atypical chest pain and preoperative cardiovascular evaluation.  She had an echocardiogram done in December 2017 which showed normal ejection fraction with no significant valvular abnormalities.  Lexiscan Myoview showed no evidence of ischemia. She reports no recent chest pain or worsening dyspnea.  She has baseline shortness of breath that has worsened over the last few years.  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  Her physical capacity is limited by knee problem as well. She is not a smoker and has no family history  of premature coronary artery disease  The patient does not have symptoms concerning for COVID-19 infection (fever, chills, cough, or new shortness of breath).    Prior CV studies:   The following studies were reviewed today:    Past Medical History:  Diagnosis Date  . Asthma   . Dyspnea    with exertion  . Frequent headaches   . GERD (gastroesophageal reflux disease)    takes protonix  . Seasonal allergies   . Sleep apnea    CPAP   Past Surgical History:  Procedure Laterality Date  . CESAREAN SECTION  1980  . CESAREAN SECTION  1992  . CHOLECYSTECTOMY N/A 04/16/2016   Procedure: LAPAROSCOPIC CHOLECYSTECTOMY;  Surgeon: Lattie Haw, MD;  Location: [facility] ORS;  Service: General;  Laterality: N/A;  . TONSILECTOMY, ADENOIDECTOMY, BILATERAL MYRINGOTOMY AND TUBES  1980  . TUBAL LIGATION  1994     Current Meds  Medication Sig  . acetaminophen (TYLENOL) 500 MG tablet Take 500 mg by mouth every 6 (six) hours as needed for moderate pain.  Marland Kitchen amLODipine (NORVASC) 10 MG tablet TAKE 1 TABLET BY MOUTH EVERY DAY  . aspirin EC 81 MG tablet Take 81 mg by mouth daily.  Marland Kitchen aspirin-acetaminophen-caffeine (EXCEDRIN MIGRAINE) 250-250-65 MG tablet Take 2 tablets by mouth every 6 (six) hours as needed for headache or migraine.  Marland Kitchen atorvastatin (LIPITOR) 40 MG tablet Take 1 tablet (40 mg total) by mouth at bedtime.  . diclofenac sodium (VOLTAREN) 1 % GEL Apply 2 g topically 3 (three) times daily as needed (knee pain, arthritis).  Marland Kitchen lisinopril-hydrochlorothiazide (  PRINZIDE,ZESTORETIC) 20-25 MG tablet Take 1 tablet by mouth daily.  Marland Kitchen MEGARED OMEGA-3 KRILL OIL 500 MG CAPS Take 500 mg by mouth daily.  . metFORMIN (GLUCOPHAGE-XR) 750 MG 24 hr tablet Take 2 tablets (1,500 mg total) by mouth daily with breakfast.  . Semaglutide (OZEMPIC) 0.25 or 0.5 MG/DOSE SOPN Inject 0.5 mg into the skin once a week. For first 4 weeks, inject dose 0.25mg  weekly     Allergies:   Patient has no known allergies.   Social  History   Tobacco Use  . Smoking status: Never Smoker  . Smokeless tobacco: Never Used  Substance Use Topics  . Alcohol use: No  . Drug use: No     Family Hx: The patient's family history includes Breast cancer in her paternal grandmother; Cancer in her father and paternal grandfather; Diabetes in her mother; Hypertension in her mother; Pancreatic cancer in her maternal grandmother; Prostate cancer in her maternal grandfather; Stroke in her father.  ROS:   Please see the history of present illness.     All other systems reviewed and are negative.   Labs/Other Tests and Data Reviewed:    Recent Labs: 04/17/2018: ALT 10; BUN 44; Creat 2.08; Hemoglobin 10.2; Platelets 386; Potassium 4.8; Sodium 141   Recent Lipid Panel Lab Results  Component Value Date/Time   CHOL 136 04/17/2018 11:39 AM   TRIG 171 (H) 04/17/2018 11:39 AM   HDL 30 (L) 04/17/2018 11:39 AM   CHOLHDL 4.5 04/17/2018 11:39 AM   LDLCALC 79 04/17/2018 11:39 AM    Wt Readings from Last 3 Encounters:  05/26/18 300 lb (136.1 kg)  04/24/18 (!) 302 lb 12.8 oz (137.3 kg)  01/09/18 (!) 308 lb (139.7 kg)     Objective:    Vital Signs:  BP (!) 138/93 (BP Location: Left Arm, Patient Position: Sitting, Cuff Size: Normal)   Ht 5' (1.524 m)   Wt 300 lb (136.1 kg)   BMI 58.59 kg/m    Well nourished, well developed female in no acute distress.   ASSESSMENT & PLAN:    1.  Preop cardiovascular evaluation for bariatric surgery: The patient currently has no anginal symptoms.  Functional capacity is reduced due to obesity and knee arthritis.  However, her symptoms have not changed since 2000 17/2018 when she had her cardiac work-up at that time.  Her work-up at that time included an echocardiogram and nuclear stress test.  Both of them were unremarkable in spite of having chronically abnormal EKG with poor R wave progression in the anterior leads. Based on that, the patient can proceed with surgery at an overall low risk from  a cardiac standpoint.  2.  Essential hypertension: Blood pressure seems to be reasonably controlled on current medications.  COVID-19 Education: The signs and symptoms of COVID-19 were discussed with the patient and how to seek care for testing (follow up with PCP or arrange E-visit).  The importance of social distancing was discussed today.  Patient Risk:   After full review of this patient's clinical status, I feel that they are at least moderate risk at this time.  Time:   Today, I have spent 30  minutes with the patient with telehealth technology discussing .     Medication Adjustments/Labs and Tests Ordered: Current medicines are reviewed at length with the patient today.  Concerns regarding medicines are outlined above.  Tests Ordered: No orders of the defined types were placed in this encounter.  Medication Changes: No orders of the defined types  were placed in this encounter.   Disposition:  Follow up prn  Signed, Lorine Bears, MD  05/26/2018 4:48 PM    [facility]

## 2018-05-26 NOTE — Telephone Encounter (Signed)
Spoke with the patient. She is willing to do a web visit for her appointment today. Instructions and consent will be given through MyChart.     TELEPHONE CALL NOTE  Preston Sam has been deemed a candidate for a follow-up tele-health visit to limit community exposure during the Covid-19 pandemic. I spoke with the patient via phone to ensure availability of phone/video source, confirm preferred email & phone number, and discuss instructions and expectations.  I reminded Levana Levit to be prepared with any vital sign and/or heart rhythm information that could potentially be obtained via home monitoring, at the time of her visit. I reminded Malessa Pattison to expect a phone call at the time of her visit if her visit.  Did the patient verbally acknowledge consent to treatment? Consent sent through MyChart  Sandi Mariscal, RN 05/26/2018 11:38 AM   DOWNLOADING THE WEBEX SOFTWARE TO SMARTPHONE  - If Apple, go to App Store and type in WebEx in the search bar. Download [employer], the blue/green circle. The app is free but as with any other app downloads, their phone may require them to verify saved payment information or Apple password. The patient does NOT have to create an account.  - If Android, ask patient to go to [employer] and type in WebEx in the search bar. Download [employer], the blue/green circle. The app is free but as with any other app downloads, their phone may require them to verify saved payment information or Android password. The patient does NOT have to create an account.   CONSENT FOR TELE-HEALTH VISIT - PLEASE REVIEW  I hereby voluntarily request, consent and authorize [facility] and its employed or contracted physicians, physician assistants, nurse practitioners or other licensed health care professionals (the Practitioner), to provide me with telemedicine health care services (the "Services") as deemed necessary by the treating Practitioner. I  acknowledge and consent to receive the Services by the Practitioner via telemedicine. I understand that the telemedicine visit will involve communicating with the Practitioner through live audiovisual communication technology and the disclosure of certain medical information by electronic transmission. I acknowledge that I have been given the opportunity to request an in-person assessment or other available alternative prior to the telemedicine visit and am voluntarily participating in the telemedicine visit.  I understand that I have the right to withhold or withdraw my consent to the use of telemedicine in the course of my care at any time, without affecting my right to future care or treatment, and that the Practitioner or I may terminate the telemedicine visit at any time. I understand that I have the right to inspect all information obtained and/or recorded in the course of the telemedicine visit and may receive copies of available information for a reasonable fee.  I understand that some of the potential risks of receiving the Services via telemedicine include:  Marland Kitchen Delay or interruption in medical evaluation due to technological equipment failure or disruption; . Information transmitted may not be sufficient (e.g. poor resolution of images) to allow for appropriate medical decision making by the Practitioner; and/or  . In rare instances, security protocols could fail, causing a breach of personal health information.  Furthermore, I acknowledge that it is my responsibility to provide information about my medical history, conditions and care that is complete and accurate to the best of my ability. I acknowledge that Practitioner's advice, recommendations, and/or decision may be based on factors not within their control, such as incomplete or inaccurate  data provided by me or distortions of diagnostic images or specimens that may result from electronic transmissions. I understand that the practice of  medicine is not an exact science and that Practitioner makes no warranties or guarantees regarding treatment outcomes. I acknowledge that I will receive a copy of this consent concurrently upon execution via email to the email address I last provided but may also request a printed copy by calling the office of [facility].    I understand that my insurance will be billed for this visit.   I have read or had this consent read to me. . I understand the contents of this consent, which adequately explains the benefits and risks of the Services being provided via telemedicine.  . I have been provided ample opportunity to ask questions regarding this consent and the Services and have had my questions answered to my satisfaction. . I give my informed consent for the services to be provided through the use of telemedicine in my medical care  By participating in this telemedicine visit I agree to the above.

## 2018-06-03 ENCOUNTER — Telehealth: Payer: Self-pay | Admitting: *Deleted

## 2018-06-03 NOTE — Telephone Encounter (Signed)
Last office visit note faxed to [facility] as requested by patient.

## 2018-06-25 ENCOUNTER — Other Ambulatory Visit: Payer: Self-pay | Admitting: Family Medicine

## 2018-06-25 DIAGNOSIS — I10 Essential (primary) hypertension: Secondary | ICD-10-CM

## 2018-08-18 ENCOUNTER — Other Ambulatory Visit: Payer: Self-pay | Admitting: Family Medicine

## 2018-08-18 DIAGNOSIS — E1140 Type 2 diabetes mellitus with diabetic neuropathy, unspecified: Secondary | ICD-10-CM

## 2018-12-07 ENCOUNTER — Other Ambulatory Visit: Payer: Self-pay | Admitting: Family Medicine

## 2018-12-07 DIAGNOSIS — E785 Hyperlipidemia, unspecified: Secondary | ICD-10-CM

## 2018-12-07 DIAGNOSIS — E1169 Type 2 diabetes mellitus with other specified complication: Secondary | ICD-10-CM

## 2018-12-10 ENCOUNTER — Other Ambulatory Visit: Payer: Self-pay | Admitting: Family Medicine

## 2018-12-10 DIAGNOSIS — I10 Essential (primary) hypertension: Secondary | ICD-10-CM

## 2018-12-23 ENCOUNTER — Other Ambulatory Visit: Payer: Self-pay | Admitting: Family Medicine

## 2018-12-23 DIAGNOSIS — I10 Essential (primary) hypertension: Secondary | ICD-10-CM

## 2019-02-10 ENCOUNTER — Other Ambulatory Visit: Payer: Self-pay | Admitting: Family Medicine

## 2019-02-10 DIAGNOSIS — E1140 Type 2 diabetes mellitus with diabetic neuropathy, unspecified: Secondary | ICD-10-CM

## 2019-02-22 ENCOUNTER — Other Ambulatory Visit: Payer: Self-pay

## 2019-02-22 ENCOUNTER — Ambulatory Visit (INDEPENDENT_AMBULATORY_CARE_PROVIDER_SITE_OTHER): Payer: BC Managed Care – PPO | Admitting: Family Medicine

## 2019-02-22 ENCOUNTER — Encounter: Payer: Self-pay | Admitting: Family Medicine

## 2019-02-22 ENCOUNTER — Ambulatory Visit: Payer: BC Managed Care – PPO | Admitting: Family Medicine

## 2019-02-22 VITALS — BP 114/80 | HR 68 | Ht 60.0 in | Wt 265.0 lb

## 2019-02-22 DIAGNOSIS — M79643 Pain in unspecified hand: Secondary | ICD-10-CM

## 2019-02-22 DIAGNOSIS — I10 Essential (primary) hypertension: Secondary | ICD-10-CM

## 2019-02-22 DIAGNOSIS — N1831 Chronic kidney disease, stage 3a: Secondary | ICD-10-CM | POA: Diagnosis not present

## 2019-02-22 DIAGNOSIS — Z6841 Body Mass Index (BMI) 40.0 and over, adult: Secondary | ICD-10-CM

## 2019-02-22 DIAGNOSIS — M7989 Other specified soft tissue disorders: Secondary | ICD-10-CM

## 2019-02-22 DIAGNOSIS — I129 Hypertensive chronic kidney disease with stage 1 through stage 4 chronic kidney disease, or unspecified chronic kidney disease: Secondary | ICD-10-CM | POA: Diagnosis not present

## 2019-02-22 MED ORDER — LISINOPRIL-HYDROCHLOROTHIAZIDE 20-25 MG PO TABS: 1.0000 | ORAL_TABLET | Freq: Every day | ORAL | 1 refills | Status: DC

## 2019-02-22 MED ORDER — DICLOFENAC SODIUM 1 % EX GEL: 2.0000 g | Freq: Three times a day (TID) | CUTANEOUS | 2 refills | Status: DC | PRN

## 2019-02-22 NOTE — Progress Notes (Signed)
Virtual Visit via Telephone The purpose of this virtual visit is to provide medical care while limiting exposure to the novel coronavirus (COVID19) for both patient and office staff.  Consent was obtained for phone visit:  Yes.   Answered questions that patient had about telehealth interaction:  Yes.   I discussed the limitations, risks, security and privacy concerns of performing an evaluation and management service by telephone. I also discussed with the patient that there may be a patient responsible charge related to this service. The patient expressed understanding and agreed to proceed.  Patient Location: Home Provider Location: [facility])  ---------------------------------------------------------------------- Chief Complaint  Patient presents with  . Hypertension    needs refill,   btw had gastric sleeve surgery on 12/21/2018    S: Reviewed CMA documentation. I have called patient and gathered additional HPI as follows:  Balance / Chronic back pain Previous referral to [facility] 2019. She had prior injection in back, thought back pain was affecting her balance and gait. Now recent swelling worse affecting her balance, see below.  HTN Bilateral LE Edema / Hand Edema Bariatric Surgery s/p 11/2018 Gastric Sleeve She has followed up with the bariatrics. She had evaluation post op by them already and due to concern with bilateral lower extremity swelling, they have referred her to get LE Doppler and ruled out DVT. - Also has hand swelling and soreness stiffness. - Recent update after her surgery she had lower BP they held her HCTZ 25mg  med, and only continued Lisinopril 20mg  daily, she also follows Dr Nephrology. She has had worse swelling since off HCTZ and continues on Amlodipine 10mg    Denies any high risk travel to areas of current concern for COVID19. Denies any known or suspected exposure to person with or possibly with COVID19.  Denies any  fevers, chills, sweats, body ache, cough, shortness of breath, sinus pain or pressure, headache, abdominal pain, diarrhea  Past Medical History:  Diagnosis Date  . Asthma   . Dyspnea    with exertion  . Frequent headaches   . GERD (gastroesophageal reflux disease)    takes protonix  . Seasonal allergies   . Sleep apnea    CPAP   Social History   Tobacco Use  . Smoking status: Never Smoker  . Smokeless tobacco: Never Used  Substance Use Topics  . Alcohol use: No  . Drug use: No    Current Outpatient Medications:  .  acetaminophen (TYLENOL) 500 MG tablet, Take 500 mg by mouth every 6 (six) hours as needed for moderate pain., Disp: , Rfl:  .  amLODipine (NORVASC) 10 MG tablet, Take 0.5 tablets (5 mg total) by mouth daily., Disp: 90 tablet, Rfl: 1 .  aspirin EC 81 MG tablet, Take 81 mg by mouth daily., Disp: , Rfl:  .  aspirin-acetaminophen-caffeine (EXCEDRIN MIGRAINE) 250-250-65 MG tablet, Take 2 tablets by mouth every 6 (six) hours as needed for headache or migraine., Disp: , Rfl:  .  atorvastatin (LIPITOR) 40 MG tablet, TAKE 1 TABLET BY MOUTH EVERYDAY AT BEDTIME, Disp: 90 tablet, Rfl: 0 .  gabapentin (NEURONTIN) 300 MG capsule, Take 300 mg by mouth 3 (three) times daily as needed., Disp: , Rfl:  .  lisinopril (ZESTRIL) 20 MG tablet, Take by mouth., Disp: , Rfl:  .  lisinopril-hydrochlorothiazide (ZESTORETIC) 20-25 MG tablet, Take 1 tablet by mouth daily., Disp: 90 tablet, Rfl: 1 .  MEGARED OMEGA-3 KRILL OIL 500 MG CAPS, Take 500 mg by mouth daily.,  Disp: , Rfl:  .  diclofenac Sodium (VOLTAREN) 1 % GEL, Apply 2 g topically 3 (three) times daily as needed., Disp: 100 g, Rfl: 2 .  metFORMIN (GLUCOPHAGE-XR) 750 MG 24 hr tablet, TAKE 2 TABLETS BY MOUTH DAILY WITH BREAKFAST (Patient not taking: Reported on 02/22/2019), Disp: 180 tablet, Rfl: 1 .  Semaglutide (OZEMPIC) 0.25 or 0.5 MG/DOSE SOPN, Inject 0.5 mg into the skin once a week. For first 4 weeks, inject dose 0.25mg  weekly (Patient  not taking: Reported on 02/22/2019), Disp: 1 pen, Rfl: 2  Depression screen [facility] 2/9 02/22/2019 04/24/2018 01/09/2018  Decreased Interest 0 0 0  Down, Depressed, Hopeless 0 0 0  PHQ - 2 Score 0 0 0    No flowsheet data found.  -------------------------------------------------------------------------- O: No physical exam performed due to remote telephone encounter.  Lab results reviewed.  No results found for this or any previous visit (from the past 2160 hour(s)).  -------------------------------------------------------------------------- A&P:  Problem List Items Addressed This Visit    Morbid obesity with BMI of 50.0-59.9, adult ([place])   Essential hypertension - Primary   Relevant Medications   lisinopril (ZESTRIL) 20 MG tablet   lisinopril-hydrochlorothiazide (ZESTORETIC) 20-25 MG tablet   amLODipine (NORVASC) 10 MG tablet   CKD (chronic kidney disease), stage III (HCC)    Other Visit Diagnoses    Intermittent pain and swelling of hand       Relevant Medications   diclofenac Sodium (VOLTAREN) 1 % GEL     #HTN / CKD / Edema Previously taken off HCTZ 25mg  daily after bariatric surgery, due to low BP. Now has worse swelling lower extremity and hands, following surgery affecting her. She has had stable BP. - Will re-adjust her BP medication - Restart Lisinopril-HCTZ 20-25mg  daily, new rx. DC Lisinopril 20mg  only  REDUCE DOSE BY HALF dose of Amlodipine 10mg  - down to 5mg  daily, cut in half. May stop in future if causing swelling - Likely with Thiazide she can have reduced swelling in hands and feet, and also reducing amlodipine will help - RICE therapy elevation, seems venous stasis as well also in obesity can improve with activity and weight loss - Less likely arthritis, will re order Diclofenac gel to use on hands/wrist as well  She can contact [facility] for back pain and affect on her balance if need other therapy injection.  Meds ordered this encounter  Medications   . lisinopril-hydrochlorothiazide (ZESTORETIC) 20-25 MG tablet    Sig: Take 1 tablet by mouth daily.    Dispense:  90 tablet    Refill:  1  . diclofenac Sodium (VOLTAREN) 1 % GEL    Sig: Apply 2 g topically 3 (three) times daily as needed.    Dispense:  100 g    Refill:  2    Follow-up: - Return in 3 months for DM A1c, HTN  Patient verbalizes understanding with the above medical recommendations including the limitation of remote medical advice.  Specific follow-up and call-back criteria were given for patient to follow-up or seek medical care more urgently if needed.   - Time spent in direct consultation with patient on phone: 11 minutes   Nobie Putnam, [facility] 02/22/2019, 3:31 PM

## 2019-02-22 NOTE — Patient Instructions (Addendum)
Start back on Lisinopril-HCTZ 20-25mg  daily Reduce amlodipine by half, from 10mg  down to 5mg  (cut in half) may reduce further if need due to BP Start diclofenac topical as needed on hands Follow up if not improve  Call [facility] to follow up with them   Please schedule a Follow-up Appointment to: Return in about 3 months (around 05/23/2019) for DM A1c.  If you have any other questions or concerns, please feel free to call the office or send a message through [facility]. You may also schedule an earlier appointment if necessary.  Additionally, you may be receiving a survey about your experience at our office within a few days to 1 week by e-mail or mail. We value your feedback.  Nobie Putnam, DO [place]

## 2019-03-04 ENCOUNTER — Telehealth: Payer: Self-pay | Admitting: Family Medicine

## 2019-03-04 NOTE — Telephone Encounter (Signed)
Pt. said that lisinopril suppose to be called in at her last appt.in Dec

## 2019-03-04 NOTE — Telephone Encounter (Signed)
Left message for patient to call back  

## 2019-03-04 NOTE — Telephone Encounter (Signed)
Last visit 02/22/19  I ordered Lisinopril-HCTZ 20-25mg  once daily. I sent 90 pills with a refill to [facility] [place] on 02/22/19.  She was advised to STOP regular Lisinopril, we did not order that one. I ordered the combo med - Lisinopril-HCTZ.  She needs to check with pharmacy, but it says it was received by them on 02/22/19.  lisinopril-hydrochlorothiazide (ZESTORETIC) 20-25 MG tablet 1 tablet, Daily 1 ordered  Summary: Take 1 tablet by mouth daily., Starting Mon 02/22/2019, Normal Dose, Route, Frequency: 1 tablet, Oral, Daily  Start: 02/22/2019  Ord/Sold: 02/22/2019 (O)  Report  Taking:  Long-term:  [id number] [place], [place] - [address]  Med Dose History  ChangeDiscontinue   Patient Sig: Take 1 tablet by mouth daily.   Ordered on: 02/22/2019   Authorized by: Smitty Cords   Dispense: 90 tablet   Saralyn Pilar, DO [facility] 03/04/2019, 12:53 PM

## 2019-03-05 ENCOUNTER — Other Ambulatory Visit: Payer: Self-pay | Admitting: Nurse Practitioner

## 2019-03-05 DIAGNOSIS — E1169 Type 2 diabetes mellitus with other specified complication: Secondary | ICD-10-CM

## 2019-03-05 DIAGNOSIS — E785 Hyperlipidemia, unspecified: Secondary | ICD-10-CM

## 2019-03-08 NOTE — Telephone Encounter (Signed)
Left detail message. 

## 2019-06-01 ENCOUNTER — Other Ambulatory Visit: Payer: Self-pay | Admitting: Family Medicine

## 2019-06-01 DIAGNOSIS — I10 Essential (primary) hypertension: Secondary | ICD-10-CM

## 2019-09-12 ENCOUNTER — Other Ambulatory Visit: Payer: Self-pay | Admitting: Family Medicine

## 2019-09-12 DIAGNOSIS — I10 Essential (primary) hypertension: Secondary | ICD-10-CM

## 2019-09-12 NOTE — Telephone Encounter (Signed)
Requested medications are due for refill today? Yes  Requested medications are on active medication list?  Yes  Last Refill:   02/22/2019  # 90 with one refill   Future visit scheduled?  No   Notes to Clinic:  Medication failed RX refill protocol due to no valid encounter in the last 6 months and no labs within the past 180 days.  Last lab work performed on 04/17/2018.

## 2019-10-14 ENCOUNTER — Ambulatory Visit: Payer: BC Managed Care – PPO | Attending: Nephrology | Admitting: Physical Therapy

## 2019-10-14 ENCOUNTER — Encounter: Payer: Self-pay | Admitting: Physical Therapy

## 2019-10-14 DIAGNOSIS — M25561 Pain in right knee: Secondary | ICD-10-CM | POA: Insufficient documentation

## 2019-10-14 DIAGNOSIS — G8929 Other chronic pain: Secondary | ICD-10-CM | POA: Diagnosis present

## 2019-10-14 DIAGNOSIS — M1712 Unilateral primary osteoarthritis, left knee: Secondary | ICD-10-CM | POA: Diagnosis not present

## 2019-10-14 DIAGNOSIS — M25562 Pain in left knee: Secondary | ICD-10-CM | POA: Diagnosis present

## 2019-10-14 DIAGNOSIS — R269 Unspecified abnormalities of gait and mobility: Secondary | ICD-10-CM | POA: Diagnosis present
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
## 2019-12-10 ENCOUNTER — Other Ambulatory Visit: Payer: Self-pay | Admitting: Family Medicine

## 2019-12-10 ENCOUNTER — Other Ambulatory Visit: Payer: Self-pay

## 2019-12-10 ENCOUNTER — Other Ambulatory Visit: Payer: BC Managed Care – PPO

## 2019-12-10 DIAGNOSIS — I129 Hypertensive chronic kidney disease with stage 1 through stage 4 chronic kidney disease, or unspecified chronic kidney disease: Secondary | ICD-10-CM

## 2019-12-10 DIAGNOSIS — E1140 Type 2 diabetes mellitus with diabetic neuropathy, unspecified: Secondary | ICD-10-CM

## 2019-12-10 DIAGNOSIS — E785 Hyperlipidemia, unspecified: Secondary | ICD-10-CM

## 2019-12-10 DIAGNOSIS — Z0000 Encounter for general adult medical examination without abnormal findings: Secondary | ICD-10-CM

## 2019-12-10 DIAGNOSIS — N183 Chronic kidney disease, stage 3 unspecified: Secondary | ICD-10-CM

## 2019-12-10 DIAGNOSIS — E1169 Type 2 diabetes mellitus with other specified complication: Secondary | ICD-10-CM

## 2019-12-11 LAB — CBC WITH DIFFERENTIAL/PLATELET
Absolute Monocytes: 248 cells/uL (ref 200–950)
Basophils Absolute: 39 cells/uL (ref 0–200)
Basophils Relative: 0.7 %
Eosinophils Absolute: 121 cells/uL (ref 15–500)
Eosinophils Relative: 2.2 %
HCT: 37 % (ref 35.0–45.0)
Hemoglobin: 11.3 g/dL — ABNORMAL LOW (ref 11.7–15.5)
Lymphs Abs: 1667 cells/uL (ref 850–3900)
MCH: 28.2 pg (ref 27.0–33.0)
MCHC: 30.5 g/dL — ABNORMAL LOW (ref 32.0–36.0)
MCV: 92.3 fL (ref 80.0–100.0)
MPV: 10.3 fL (ref 7.5–12.5)
Monocytes Relative: 4.5 %
Neutro Abs: 3427 cells/uL (ref 1500–7800)
Neutrophils Relative %: 62.3 %
Platelets: 385 10*3/uL (ref 140–400)
RBC: 4.01 10*6/uL (ref 3.80–5.10)
RDW: 13.1 % (ref 11.0–15.0)
Total Lymphocyte: 30.3 %
WBC: 5.5 10*3/uL (ref 3.8–10.8)

## 2019-12-11 LAB — COMPLETE METABOLIC PANEL WITH GFR
AG Ratio: 1.1 (calc) (ref 1.0–2.5)
ALT: 21 U/L (ref 6–29)
AST: 17 U/L (ref 10–35)
Albumin: 4.1 g/dL (ref 3.6–5.1)
Alkaline phosphatase (APISO): 82 U/L (ref 37–153)
BUN/Creatinine Ratio: 28 (calc) — ABNORMAL HIGH (ref 6–22)
BUN: 43 mg/dL — ABNORMAL HIGH (ref 7–25)
CO2: 24 mmol/L (ref 20–32)
Calcium: 10.7 mg/dL — ABNORMAL HIGH (ref 8.6–10.4)
Chloride: 103 mmol/L (ref 98–110)
Creat: 1.54 mg/dL — ABNORMAL HIGH (ref 0.50–1.05)
GFR, Est African American: 43 mL/min/{1.73_m2} — ABNORMAL LOW (ref >=60)
GFR, Est Non African American: 37 mL/min/{1.73_m2} — ABNORMAL LOW (ref >=60)
Globulin: 3.9 g/dL (calc) — ABNORMAL HIGH (ref 1.9–3.7)
Glucose, Bld: 79 mg/dL (ref 65–99)
Potassium: 4.5 mmol/L (ref 3.5–5.3)
Sodium: 137 mmol/L (ref 135–146)
Total Bilirubin: 0.4 mg/dL (ref 0.2–1.2)
Total Protein: 8 g/dL (ref 6.1–8.1)

## 2019-12-11 LAB — HEMOGLOBIN A1C
Hgb A1c MFr Bld: 4.8 % of total Hgb (ref <5.7)
Mean Plasma Glucose: 91 (calc)
eAG (mmol/L): 5 (calc)

## 2019-12-11 LAB — LIPID PANEL
Cholesterol: 148 mg/dL (ref <200)
HDL: 36 mg/dL — ABNORMAL LOW (ref >=50)
LDL Cholesterol (Calc): 91 mg/dL (calc)
Non-HDL Cholesterol (Calc): 112 mg/dL (calc) (ref <130)
Total CHOL/HDL Ratio: 4.1 (calc) (ref <5.0)
Triglycerides: 109 mg/dL (ref <150)

## 2019-12-11 LAB — TSH: TSH: 2.48 mIU/L (ref 0.40–4.50)

## 2019-12-17 ENCOUNTER — Other Ambulatory Visit: Payer: Self-pay

## 2019-12-17 ENCOUNTER — Encounter: Payer: Self-pay | Admitting: Family Medicine

## 2019-12-17 ENCOUNTER — Ambulatory Visit (INDEPENDENT_AMBULATORY_CARE_PROVIDER_SITE_OTHER): Payer: BC Managed Care – PPO | Admitting: Family Medicine

## 2019-12-17 VITALS — BP 105/68 | HR 60 | Temp 97.7°F | Resp 16 | Ht 60.0 in | Wt 195.0 lb

## 2019-12-17 DIAGNOSIS — Z23 Encounter for immunization: Secondary | ICD-10-CM | POA: Diagnosis not present

## 2019-12-17 DIAGNOSIS — Z0000 Encounter for general adult medical examination without abnormal findings: Secondary | ICD-10-CM | POA: Diagnosis not present

## 2019-12-17 DIAGNOSIS — E1169 Type 2 diabetes mellitus with other specified complication: Secondary | ICD-10-CM

## 2019-12-17 DIAGNOSIS — Z1211 Encounter for screening for malignant neoplasm of colon: Secondary | ICD-10-CM

## 2019-12-17 DIAGNOSIS — Z124 Encounter for screening for malignant neoplasm of cervix: Secondary | ICD-10-CM

## 2019-12-17 DIAGNOSIS — G4733 Obstructive sleep apnea (adult) (pediatric): Secondary | ICD-10-CM

## 2019-12-17 DIAGNOSIS — I10 Essential (primary) hypertension: Secondary | ICD-10-CM

## 2019-12-17 DIAGNOSIS — I129 Hypertensive chronic kidney disease with stage 1 through stage 4 chronic kidney disease, or unspecified chronic kidney disease: Secondary | ICD-10-CM

## 2019-12-17 DIAGNOSIS — E1140 Type 2 diabetes mellitus with diabetic neuropathy, unspecified: Secondary | ICD-10-CM

## 2019-12-17 DIAGNOSIS — Z1231 Encounter for screening mammogram for malignant neoplasm of breast: Secondary | ICD-10-CM

## 2019-12-17 DIAGNOSIS — N183 Chronic kidney disease, stage 3 unspecified: Secondary | ICD-10-CM

## 2019-12-17 DIAGNOSIS — E785 Hyperlipidemia, unspecified: Secondary | ICD-10-CM

## 2019-12-17 DIAGNOSIS — N1831 Chronic kidney disease, stage 3a: Secondary | ICD-10-CM

## 2019-12-17 MED ORDER — LISINOPRIL-HYDROCHLOROTHIAZIDE 20-25 MG PO TABS: 1.0000 | ORAL_TABLET | Freq: Every day | ORAL | 3 refills | Status: DC

## 2019-12-17 MED ORDER — ATORVASTATIN CALCIUM 40 MG PO TABS: 40.0000 mg | ORAL_TABLET | Freq: Every day | ORAL | 3 refills | Status: DC

## 2019-12-17 NOTE — Patient Instructions (Addendum)
Thank you for coming to the office today.  Call [employer] [phone] (next week sometime)  - we will fax them now for a new order  They will send you a new kit once you leave the sample needs to be received within 72 hours.  BP is good, but we may not need as much medicine now.  Trial on STOPPING Amlodipine 38m daily - this can help reduce swelling by stopping this one.  Keep lisinopril-HCTZ for now.  Check BP - goal is < 135/85 consistently.  [facility] [place] Please [place] of your next Diabetic Eye report.  -----------------------------------------------  COVID Vaccine Information  [facility] - check their websites or call to schedule in advance. Appointment is required.  CHypoluxoVaccine Information  [email or web address] Appointments are required. To register for your free vaccination appointment, click the link on the date on the calendar located on the website or call [phone]  NOTICE ON BOOSTERS PChief Operating OfficerNow Available to Eligible Populations [place] is now offering at all [facility] clinics a Pfizer booster dose to the following eligible populations, six months after receiving the second dose of the Pfizer vaccine:  People ages 654and older and residents in long-term care settings should receive it. People ages 596to 619with underlying medical conditions should receive it. People ages 160to 475with underlying medical conditions may receive it, based on individual benefits and risks People ages 194to 638who are at increased risk of occupational exposure and transmission, such as health care workers, may receive it, based on individual benefits and risks Appointments are required. To register, click the link on the date in the calendar above or call [phone] Monday-Friday, 7 a.m.-7 p.m.   Please schedule a  Follow-up Appointment to: Return in about 4 months (around 04/18/2020) for 4 month follow-up DM A1c, HTN, Tingling, Weight.  If you have any other questions or concerns, please feel free to call the office or send a message through [place] You may also schedule an earlier appointment if necessary.  Additionally, you may be receiving a survey about your experience at our office within a few days to 1 week by e-mail or mail. We value your feedback.  ANobie Putnam DO [place]

## 2019-12-17 NOTE — Assessment & Plan Note (Signed)
OSA is controlled with weight loss s/p bariatric surgery No longer on CPAP

## 2019-12-17 NOTE — Progress Notes (Signed)
Subjective:    Patient ID: Ashley Terry, female    DOB: 09-16-1965, 54 y.o.   MRN: [id number]  Ashley Terry is a 54 y.o. female presenting on 12/17/2019 for Annual Exam   HPI   Here for Annual Physical and Lab Reivew.  Morbid Obesity, BMI >38 Followed by [facility] S/p Lap Gastric Sleeve - Dr Andrey Farmer - [facility] on 12/21/18 Weight loss about 100 lbs in 1 year Currently doing well. Had 1  Year follow-up apt with [facility] and they requested blood work.  CHRONIC HTN with CKD-III Followed by Dr Anthonette Legato, last visit 07/2019 Reports she still is on her BP medication after bariatric surgery Checking BP at home avg is low, sometimes feels dizziness Current Meds - Amlodipine 77m daily, Lisinopril-HCTZ 20-231mdaily   Reports good compliance, took meds today. Tolerating well, w/o complaints. Lifestyle: - Diet: admits 9 glasses water, sometimes more, but not always hydrating as much Admits edema lower extremity [facility] Balance improved Denies CP, dyspnea, HA, lightheadedness  CHRONIC DM, Type 2: Reports no concern. She is doing very well. Dramatic weight loss after gastric sleeve surgery. She was taken off Metformin and Ozempic prior to surgery and never returned. CBGs: Not checking Meds: None now - OFF Metformin, Ozempic for nearly 1 year after bariatric surgery Currently on ACEi Denies hypoglycemia, polyuria, visual changes, numbness or tingling.  OSA No longer on CPAP after weight loss bariatric  Carpal Tunnel tingling   Health Maintenance: Needs GYN for pap.  Due for colon CA screening, request update on cologuard order.  [facility] 12/17/2019 02/22/2019 04/24/2018  Decreased Interest 0 0 0  Down, Depressed, Hopeless 0 0 0  PHQ - 2 Score 0 0 0    Past Medical History:  Diagnosis Date  . Asthma   . Dyspnea    with exertion  . Frequent headaches   . GERD (gastroesophageal reflux  disease)    takes protonix  . Seasonal allergies   . Sleep apnea    CPAP   Past Surgical History:  Procedure Laterality Date  . CESAREAN SECTION  1980  . CESAREAN SECTION  1992  . CHOLECYSTECTOMY N/A 04/16/2016   Procedure: LAPAROSCOPIC CHOLECYSTECTOMY;  Surgeon: RiFlorene GlenMD;  Location: [facility] ORS;  Service: General;  Laterality: N/A;  . TONSILECTOMY, ADENOIDECTOMY, BILATERAL MYRINGOTOMY AND TUBES  1980  . TUBAL LIGATION  1994   Social History   Socioeconomic History  . Marital status: Widowed    Spouse name: Not on file  . Number of children: Not on file  . Years of education: Not on file  . Highest education level: Not on file  Occupational History  . 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 Smoking status: Never Smoker  . Smokeless tobacco: Never Used  Substance and Sexual Activity  . Alcohol use: No  . Drug use: No  . Sexual activity: Never  Other Topics Concern  . Not on file  Social History Narrative  . Not on file   Social Determinants of Health   Financial Resource Strain:   . Difficulty of Paying Living Expenses: Not on file  Food Insecurity:   . Worried About RuCharity fundraisern the Last Year: Not on file  . Ran Out of Food in the Last Year: Not on file  Transportation Needs:   . Lack of Transportation (Medical): Not on file  . Lack  of Transportation (Non-Medical): Not on file  Physical Activity:   . Days of Exercise per Week: Not on file  . Minutes of Exercise per Session: Not on file  Stress:   . Feeling of Stress : Not on file  Social Connections:   . Frequency of Communication with Friends and Family: Not on file  . Frequency of Social Gatherings with Friends and Family: Not on file  . Attends Religious Services: Not on file  . Active Member of Clubs or Organizations: Not on file  . Attends Archivist Meetings: Not on file  . Marital Status: Not on file  Intimate Partner Violence:   . Fear of  Current or Ex-Partner: Not on file  . Emotionally Abused: Not on file  . Physically Abused: Not on file  . Sexually Abused: Not on file   Family History  Problem Relation Age of Onset  . Diabetes Mother   . Hypertension Mother   . Stroke Father   . Cancer Father        Amyloid  . Pancreatic cancer Maternal Grandmother   . Breast cancer Paternal Grandmother   . Prostate cancer Maternal Grandfather   . Cancer Paternal Grandfather        unknown   Current Outpatient Medications on File Prior to Visit  Medication Sig  . acetaminophen (TYLENOL) 500 MG tablet Take 500 mg by mouth every 6 (six) hours as needed for moderate pain.  Marland Kitchen aspirin EC 81 MG tablet Take 81 mg by mouth daily.  Marland Kitchen aspirin-acetaminophen-caffeine (EXCEDRIN MIGRAINE) 250-250-65 MG tablet Take 2 tablets by mouth every 6 (six) hours as needed for headache or migraine.  . diclofenac Sodium (VOLTAREN) 1 % GEL Apply 2 g topically 3 (three) times daily as needed.  Marland Kitchen MEGARED OMEGA-3 KRILL OIL 500 MG CAPS Take 500 mg by mouth daily.   No current facility-administered medications on file prior to visit.    Review of Systems  Constitutional: Negative for activity change, appetite change, chills, diaphoresis, fatigue and fever.  HENT: Negative for congestion and hearing loss.   Eyes: Negative for visual disturbance.  Respiratory: Negative for cough, chest tightness, shortness of breath and wheezing.   Cardiovascular: Negative for chest pain, palpitations and leg swelling.  Gastrointestinal: Negative for abdominal pain, anal bleeding, blood in stool, constipation, diarrhea, nausea and vomiting.  Endocrine: Negative for cold intolerance.  Genitourinary: Negative for dysuria, frequency and hematuria.  Musculoskeletal: Negative for arthralgias, back pain and neck pain.  Skin: Negative for rash.  Allergic/Immunologic: Negative for environmental allergies.  Neurological: Negative for dizziness, weakness, light-headedness, numbness  and headaches.  Hematological: Negative for adenopathy.  Psychiatric/Behavioral: Negative for behavioral problems, dysphoric mood and sleep disturbance.   Per HPI unless specifically indicated above      Objective:    BP 105/68   Pulse 60   Temp 97.7 F (36.5 C) (Temporal)   Resp 16   Ht 5' (1.524 m)   Wt 195 lb (88.5 kg)   SpO2 100%   BMI 38.08 kg/m   Wt Readings from Last 3 Encounters:  12/17/19 195 lb (88.5 kg)  02/22/19 265 lb (120.2 kg)  05/26/18 300 lb (136.1 kg)    Physical Exam Vitals and nursing note reviewed.  Constitutional:      General: She is not in acute distress.    Appearance: She is well-developed. She is not diaphoretic.     Comments: Well-appearing, comfortable, cooperative, major weight loss  HENT:     Head:  Normocephalic and atraumatic.  Eyes:     General:        Right eye: No discharge.        Left eye: No discharge.     Conjunctiva/sclera: Conjunctivae normal.     Pupils: Pupils are equal, round, and reactive to light.  Neck:     Thyroid: No thyromegaly.  Cardiovascular:     Rate and Rhythm: Normal rate and regular rhythm.     Heart sounds: Normal heart sounds. No murmur heard.   Pulmonary:     Effort: Pulmonary effort is normal. No respiratory distress.     Breath sounds: Normal breath sounds. No wheezing or rales.  Abdominal:     General: Bowel sounds are normal. There is no distension.     Palpations: Abdomen is soft. There is no mass.     Tenderness: There is no abdominal tenderness.  Musculoskeletal:        General: No tenderness. Normal range of motion.     Cervical back: Normal range of motion and neck supple.     Right lower leg: No edema.     Left lower leg: No edema.     Comments: Upper / Lower Extremities: - Normal muscle tone, strength bilateral upper extremities 5/5, lower extremities 5/5  Lymphadenopathy:     Cervical: No cervical adenopathy.  Skin:    General: Skin is warm and dry.     Findings: No erythema or rash.   Neurological:     Mental Status: She is alert and oriented to person, place, and time.     Comments: Distal sensation intact to light touch all extremities  Psychiatric:        Behavior: Behavior normal.     Comments: Well groomed, good eye contact, normal speech and thoughts      Diabetic Foot Exam - Simple   Simple Foot Form Diabetic Foot exam was performed with the following findings: Yes 12/17/2019  2:02 PM  Visual Inspection See comments: Yes Sensation Testing Intact to touch and monofilament testing bilaterally: Yes Pulse Check Posterior Tibialis and Dorsalis pulse intact bilaterally: Yes Comments Bilateral with some callus formation heels, no ulceration. No deformity. Some ankle edema chronically.     Results for orders placed or performed in visit on 12/10/19  Hemoglobin A1c  Result Value Ref Range   Hgb A1c MFr Bld 4.8 <5.7 % of total Hgb   Mean Plasma Glucose 91 (calc)   eAG (mmol/L) 5.0 (calc)  CBC with Differential/Platelet  Result Value Ref Range   WBC 5.5 3.8 - 10.8 Thousand/uL   RBC 4.01 3.80 - 5.10 Million/uL   Hemoglobin 11.3 (L) 11.7 - 15.5 g/dL   HCT 37.0 35 - 45 %   MCV 92.3 80.0 - 100.0 fL   MCH 28.2 27.0 - 33.0 pg   MCHC 30.5 (L) 32.0 - 36.0 g/dL   RDW 13.1 11.0 - 15.0 %   Platelets 385 140 - 400 Thousand/uL   MPV 10.3 7.5 - 12.5 fL   Neutro Abs 3,427 1,500 - 7,800 cells/uL   Lymphs Abs 1,667 850 - 3,900 cells/uL   Absolute Monocytes 248 200 - 950 cells/uL   Eosinophils Absolute 121 15.0 - 500.0 cells/uL   Basophils Absolute 39 0.0 - 200.0 cells/uL   Neutrophils Relative % 62.3 %   Total Lymphocyte 30.3 %   Monocytes Relative 4.5 %   Eosinophils Relative 2.2 %   Basophils Relative 0.7 %  COMPLETE METABOLIC PANEL WITH GFR  Result  Value Ref Range   Glucose, Bld 79 65 - 99 mg/dL   BUN 43 (H) 7 - 25 mg/dL   Creat 1.54 (H) 0.50 - 1.05 mg/dL   GFR, Est Non African American 37 (L) > OR = 60 mL/min/1.34m   GFR, Est African American 43 (L) > OR  = 60 mL/min/1.790m  BUN/Creatinine Ratio 28 (H) 6 - 22 (calc)   Sodium 137 135 - 146 mmol/L   Potassium 4.5 3.5 - 5.3 mmol/L   Chloride 103 98 - 110 mmol/L   CO2 24 20 - 32 mmol/L   Calcium 10.7 (H) 8.6 - 10.4 mg/dL   Total Protein 8.0 6.1 - 8.1 g/dL   Albumin 4.1 3.6 - 5.1 g/dL   Globulin 3.9 (H) 1.9 - 3.7 g/dL (calc)   AG Ratio 1.1 1.0 - 2.5 (calc)   Total Bilirubin 0.4 0.2 - 1.2 mg/dL   Alkaline phosphatase (APISO) 82 37 - 153 U/L   AST 17 10 - 35 U/L   ALT 21 6 - 29 U/L  Lipid panel  Result Value Ref Range   Cholesterol 148 <200 mg/dL   HDL 36 (L) > OR = 50 mg/dL   Triglycerides 109 <150 mg/dL   LDL Cholesterol (Calc) 91 mg/dL (calc)   Total CHOL/HDL Ratio 4.1 <5.0 (calc)   Non-HDL Cholesterol (Calc) 112 <130 mg/dL (calc)  TSH  Result Value Ref Range   TSH 2.48 0.40 - 4.50 mIU/L      Assessment & Plan:   Problem List Items Addressed This Visit    OSA (obstructive sleep apnea)    OSA is controlled with weight loss s/p bariatric surgery No longer on CPAP      Morbid obesity (HCC)    BMI >38 with comorbid conditions, DM2, HTN, HLD CKD Dramatic improve wt loss >100  Lb in 1 year with gastric sleeve surgery      Hyperlipidemia associated with type 2 diabetes mellitus (HCC)    Controlled cholesterol on statin improving lifestyle The 10-year ASCVD risk score (GMikey BussingC Jr., et al., 2013) is: 7.1%  Plan: 1. Continue current meds - Atorvastatin 4021maily 2. Continue ASA 35m79mr primary ASCVD risk reduction 3. 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 OFF all diabetic medications - Ozempic, Metformin 2. Encourage improved lifestyle - low carb, low sugar diet, reduce portion size, continue improving regular exercise - still limited by knee Continue ASA, ACEi, Statin Check A1c q 6-12 month      Relevant Medications   atorvastatin (LIPITOR) 40 MG tablet   lisinopril-hydrochlorothiazide (ZESTORETIC) 20-25 MG tablet   CKD (chronic kidney disease), stage III (HCC)[place] Followed by Dr LateHolley Raringmed management on ACEi      Benign hypertension with CKD (chronic kidney disease) stage III (HCC)    Dramatically Improved BP, HTN controlled - post bariatric surgery Complicated by CKD-III-IV - progressive decline in kidney function now    Plan:  Discontinue Amlodipine 10mg22m to low blood pressure and  edema Continue Lisinopril-HCTZ 20-51m daily  Encourage improved lifestyle - low sodium diet, regular exercise Continue monitor BP outside office, bring readings to next visit, if persistently >140/90 or new symptoms notify office sooner      Relevant Medications   atorvastatin (LIPITOR) 40 MG tablet   lisinopril-hydrochlorothiazide (ZESTORETIC) 20-25 MG tablet    Other Visit Diagnoses    Annual physical exam    -  Primary   Needs flu shot       Relevant Orders   Flu Vaccine QUAD 36+ mos IM (Completed)   Cervical cancer screening       Relevant Orders   Ambulatory referral to Obstetrics / Gynecology   Screening for colon cancer       Relevant Orders   Cologuard   Essential hypertension       Relevant Medications   atorvastatin (LIPITOR) 40 MG tablet   lisinopril-hydrochlorothiazide (ZESTORETIC) 20-25 MG tablet   Encounter for screening mammogram for malignant neoplasm of breast          Updated Health Maintenance information - Due for Mammogram [facility] [place] in 12/2019 - Flu shot today - Colon CA screening  - not updated, prior cologuard ordered 2019 sent back but was inconclusive, they need to speak with her first # given to call cologuard and we will place re order, she will get new instructions and new kit. - COVID booster due in near future - referral to GYN For Pap  Reviewed recent lab results with patient Encouraged improvement to lifestyle with diet and exercise - Goal of weight loss  Orders Placed This Encounter  Procedures  . Flu Vaccine QUAD 36+ mos IM  . Cologuard  . Ambulatory referral to Obstetrics / Gynecology    Referral Priority:   Routine    Referral Type:   Consultation    Referral Reason:   Specialty Services Required    Requested Specialty:   Obstetrics and Gynecology    Number of Visits Requested:   1     Meds ordered this encounter  Medications  . atorvastatin (LIPITOR) 40 MG tablet    Sig: Take 1 tablet (40 mg total) by mouth at bedtime.    Dispense:  90 tablet    Refill:  3  . lisinopril-hydrochlorothiazide (ZESTORETIC) 20-25 MG tablet    Sig: Take 1 tablet by mouth daily.    Dispense:  90 tablet    Refill:  3      Follow up plan: Return in about 4 months (around 04/18/2020) for 4 month follow-up DM A1c, HTN, Tingling, Weight.  ANobie Putnam DO [facility] 12/17/2019, 1:40 PM

## 2019-12-18 NOTE — Assessment & Plan Note (Signed)
Followed by Dr Cherylann Ratel On med management on ACEi

## 2019-12-18 NOTE — Assessment & Plan Note (Signed)
Dramatically Improved BP, HTN controlled - post bariatric surgery Complicated by CKD-III-IV - progressive decline in kidney function now    Plan:  Discontinue Amlodipine 10mg  due to low blood pressure and edema Continue Lisinopril-HCTZ 20-25mg  daily  Encourage improved lifestyle - low sodium diet, regular exercise Continue monitor BP outside office, bring readings to next visit, if persistently >140/90 or new symptoms notify office sooner

## 2019-12-18 NOTE — Assessment & Plan Note (Signed)
Controlled cholesterol on statin improving lifestyle The 10-year ASCVD risk score Denman George DC Jr., et al., 2013) is: 7.1%  Plan: 1. Continue current meds - Atorvastatin 40mg  daily 2. Continue ASA 81mg  for primary ASCVD risk reduction 3. Encourage improved lifestyle - low carb/cholesterol, reduce portion size, continue improving regular exercise

## 2019-12-18 NOTE — Assessment & Plan Note (Signed)
BMI >38 with comorbid conditions, DM2, HTN, HLD CKD Dramatic improve wt loss >100  Lb in 1 year with gastric sleeve surgery

## 2019-12-18 NOTE — Assessment & Plan Note (Signed)
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 OFF all diabetic medications - Ozempic, Metformin 2. Encourage improved lifestyle - low carb, low sugar diet, reduce portion size, continue improving regular exercise - still limited by knee Continue ASA, ACEi, Statin Check A1c q 6-12 month

## 2019-12-20 ENCOUNTER — Telehealth: Payer: Self-pay | Admitting: Obstetrics & Gynecology

## 2019-12-20 NOTE — Telephone Encounter (Signed)
Ashley Terry medical referring for Cervical cancer screening. Called and left voicemail for patient to call back to be scheduled.

## 2019-12-22 NOTE — Telephone Encounter (Signed)
Called and left voicemail for patient to call back to be scheduled. 

## 2019-12-23 NOTE — Telephone Encounter (Signed)
Voicemail box is full unable to leave message to have patient call back to be scheduled.  

## 2020-01-04 LAB — COLOGUARD: Cologuard: NEGATIVE

## 2020-01-12 LAB — EXTERNAL GENERIC LAB PROCEDURE: COLOGUARD: NEGATIVE

## 2020-01-14 ENCOUNTER — Encounter: Payer: Self-pay | Admitting: Family Medicine

## 2020-04-18 ENCOUNTER — Other Ambulatory Visit: Payer: Self-pay

## 2020-04-18 ENCOUNTER — Encounter: Payer: Self-pay | Admitting: Family Medicine

## 2020-04-18 ENCOUNTER — Ambulatory Visit (INDEPENDENT_AMBULATORY_CARE_PROVIDER_SITE_OTHER): Payer: BC Managed Care – PPO | Admitting: Family Medicine

## 2020-04-18 VITALS — BP 110/57 | HR 65 | Temp 97.5°F | Ht 60.0 in | Wt 181.4 lb

## 2020-04-18 DIAGNOSIS — I129 Hypertensive chronic kidney disease with stage 1 through stage 4 chronic kidney disease, or unspecified chronic kidney disease: Secondary | ICD-10-CM

## 2020-04-18 DIAGNOSIS — D509 Iron deficiency anemia, unspecified: Secondary | ICD-10-CM

## 2020-04-18 DIAGNOSIS — Z9884 Bariatric surgery status: Secondary | ICD-10-CM

## 2020-04-18 DIAGNOSIS — N183 Chronic kidney disease, stage 3 unspecified: Secondary | ICD-10-CM

## 2020-04-18 DIAGNOSIS — E1169 Type 2 diabetes mellitus with other specified complication: Secondary | ICD-10-CM

## 2020-04-18 DIAGNOSIS — M7989 Other specified soft tissue disorders: Secondary | ICD-10-CM

## 2020-04-18 DIAGNOSIS — E559 Vitamin D deficiency, unspecified: Secondary | ICD-10-CM

## 2020-04-18 DIAGNOSIS — M79643 Pain in unspecified hand: Secondary | ICD-10-CM

## 2020-04-18 DIAGNOSIS — E785 Hyperlipidemia, unspecified: Secondary | ICD-10-CM

## 2020-04-18 DIAGNOSIS — E1140 Type 2 diabetes mellitus with diabetic neuropathy, unspecified: Secondary | ICD-10-CM

## 2020-04-18 LAB — CBC WITH DIFFERENTIAL/PLATELET: Hemoglobin: 12.1 g/dL (ref 11.7–15.5)

## 2020-04-18 LAB — POCT GLYCOSYLATED HEMOGLOBIN (HGB A1C): Hemoglobin A1C: 4.8 % (ref 4.0–5.6)

## 2020-04-18 MED ORDER — DICLOFENAC SODIUM 1 % EX GEL: 2.0000 g | Freq: Three times a day (TID) | CUTANEOUS | 2 refills | Status: AC | PRN

## 2020-04-18 NOTE — Patient Instructions (Addendum)
Thank you for coming to the office today.  BP improved on recheck, false alarm. In future we could still reduce BP medications. For now can hold off of Statin medication for cholesterol, we can revisit a LOW DOSE ONLY in the future.  Lab panel will fax to [facility] at [place].  Recent Labs    12/10/19 0831 04/18/20 0920  HGBA1C 4.8 4.8   Please try to get a copy of your last eye exam sent to [place].  Please schedule a Follow-up Appointment to: Return in about 6 months (around 10/16/2020) for 6 month follow-up HTN.  If you have any other questions or concerns, please feel free to call the office or send a message through MyChart. You may also schedule an earlier appointment if necessary.  Additionally, you may be receiving a survey about your experience at our office within a few days to 1 week by e-mail or mail. We value your feedback.  Saralyn Pilar, DO [facility], [place]

## 2020-04-18 NOTE — Assessment & Plan Note (Signed)
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 OFF all diabetic medications - Ozempic, Metformin Encourage improved lifestyle - low carb, low sugar diet, reduce portion size, continue improving regular exercise - still limited by knee Continue ASA, ACEi, Statin Check A1c yearly or sooner if need

## 2020-04-18 NOTE — Assessment & Plan Note (Signed)
Remains controlled BP False alarm elevated BP initially on electronic cuff but manual repeat normalized for her. HTN controlled - post bariatric surgery Complicated by CKD-III-IV - progressive decline in kidney function now OFF Amlodipine   Plan:  Continue Lisinopril-HCTZ 20-25mg  daily for now continue - we discussed may reduce dose in future as well. May contribute to dizziness - if NO abnormal labs we can consider reduce thiazide HCTZ 25mg  component can reduce to 12.5 or off in future.  Encourage improved lifestyle - low sodium diet, regular exercise Continue monitor BP outside office, bring readings to next visit, if persistently >140/90 or new symptoms notify office sooner

## 2020-04-18 NOTE — Progress Notes (Signed)
Subjective:    Patient ID: Ashley Terry, female    DOB: 1965-03-30, 55 y.o.   MRN: [id number]  Ashley Terry is a 55 y.o. female presenting on 04/18/2020 for Follow-up, Hypertension (Pt monitors it at home and has been good), Tingling (Right hand and it happens constant./Both legs from knee down and it happens constant ), Weight Loss (Weight has been around in the 180-190 lb), and Diabetes   HPI   Morbid Obesity, BMI >35 Followed by [facility] S/p Lap Gastric Sleeve - Dr Charlton Amor - [facility] on 12/21/18 Weight loss about 100 lbs in 1 year Down 14 lbs in past 4 months Currently doing well. Had 1  Year follow-up apt with [facility] and they requested blood work. She needs labs drawn here today sent to [facility] for review. All labs post-op and vitamins.  CHRONIC HTN with CKD-III Followed by Dr Mady Haagensen, last visit 12/2019 Reports she still is on her BP medication after bariatric surgery Checking BP at home avg 100-110/60-70s. Occasionally feels dizziness, unsure if BP or if not eating >3 hours.  Bariatric wanted [place] to check labs now, may be cause of symptoms Current Meds - Lisinopril-HCTZ 20-25mg  daily - still taking. Reports good compliance, took meds today. Tolerating well, w/o complaints. Lifestyle: - Diet: staying hydrated. Still limited diet. Admits edema lower extremity Last [facility] Exam recently  - Dr Sheppard Evens, will request record. Balance improved Denies CP, dyspnea, HA, lightheadedness  CHRONIC DM, Type 2: Reports no concern. She is doing very well. Dramatic weight loss after gastric sleeve surgery. She was taken off Metformin and Ozempic prior to surgery and never returned. CBGs: Not checking Meds: None now - OFF Metformin, Ozempic for >1.5 year after bariatric surgery Currently on ACEi Denies hypoglycemia, polyuria, visual changes, numbness or tingling.  Tingling R>L left Left handed Lower Extremity Reduced energy, as discussed, she will need  vitamin testing. Weakness R hand  OSA No longer on CPAP after weight loss bariatric   Depression screen [facility] 2/9 12/17/2019 02/22/2019 04/24/2018  Decreased Interest 0 0 0  Down, Depressed, Hopeless 0 0 0  PHQ - 2 Score 0 0 0    Social History   Tobacco Use  . Smoking status: Never Smoker  . Smokeless tobacco: Never Used  Substance Use Topics  . Alcohol use: No  . Drug use: No    Review of Systems Per HPI unless specifically indicated above     Objective:    BP (!) 110/57 (BP Location: Left Arm, Cuff Size: Normal)   Pulse 65   Temp (!) 97.5 F (36.4 C) (Temporal)   Ht 5' (1.524 m)   Wt 181 lb 6.4 oz (82.3 kg)   SpO2 100%   BMI 35.43 kg/m   Wt Readings from Last 3 Encounters:  04/18/20 181 lb 6.4 oz (82.3 kg)  12/17/19 195 lb (88.5 kg)  02/22/19 265 lb (120.2 kg)    Physical Exam Vitals and nursing note reviewed.  Constitutional:      General: She is not in acute distress.    Appearance: She is well-developed and well-nourished. She is not diaphoretic.     Comments: Well-appearing, comfortable, cooperative  HENT:     Head: Normocephalic and atraumatic.     Mouth/Throat:     Mouth: Oropharynx is clear and moist.  Eyes:     General:        Right eye: No discharge.        Left eye: No discharge.  Conjunctiva/sclera: Conjunctivae normal.  Neck:     Thyroid: No thyromegaly.  Cardiovascular:     Rate and Rhythm: Normal rate and regular rhythm.     Pulses: Intact distal pulses.     Heart sounds: Normal heart sounds. No murmur heard.   Pulmonary:     Effort: Pulmonary effort is normal. No respiratory distress.     Breath sounds: Normal breath sounds. No wheezing or rales.  Musculoskeletal:        General: No edema. Normal range of motion.     Cervical back: Normal range of motion and neck supple.  Lymphadenopathy:     Cervical: No cervical adenopathy.  Skin:    General: Skin is warm and dry.     Findings: No erythema or rash.  Neurological:      Mental Status: She is alert and oriented to person, place, and time.  Psychiatric:        Mood and Affect: Mood and affect normal.        Behavior: Behavior normal.     Comments: Well groomed, good eye contact, normal speech and thoughts        Results for orders placed or performed in visit on 04/18/20  POCT HgB A1C  Result Value Ref Range   Hemoglobin A1C 4.8 4.0 - 5.6 %      Assessment & Plan:   Problem List Items Addressed This Visit    S/P laparoscopic sleeve gastrectomy   Relevant Orders   CBC with Differential/Platelet   COMPLETE METABOLIC PANEL WITH GFR   Lipid panel   VITAMIN D 25 Hydroxy (Vit-D Deficiency, Fractures)   PTH, Intact and Calcium   Iron, TIBC and Ferritin Panel   Copper, serum   Folate   Magnesium   Phosphorus   Vitamin A   Vitamin B1   Vitamin B12   Vitamin K1, Serum   Zinc   Morbid obesity (HCC)   Hyperlipidemia associated with type 2 diabetes mellitus (HCC)   Relevant Orders   Lipid panel   Controlled type 2 diabetes with neuropathy (HCC) - Primary    A1c 4.8, resolved DM No hypoglycemia Complications - 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 OFF all diabetic medications - Ozempic, Metformin Encourage improved lifestyle - low carb, low sugar diet, reduce portion size, continue improving regular exercise - still limited by knee Continue ASA, ACEi, Statin Check A1c yearly or sooner if need      Relevant Orders   POCT HgB A1C (Completed)   COMPLETE METABOLIC PANEL WITH GFR   Benign hypertension with CKD (chronic kidney disease) stage III (HCC)    Remains controlled BP False alarm elevated BP initially on electronic cuff but manual repeat normalized for her. HTN controlled - post bariatric surgery Complicated by CKD-III-IV - progressive decline in kidney function now OFF Amlodipine   Plan:   Continue Lisinopril-HCTZ 20-25mg  daily for now continue - we discussed may reduce dose in future as well. May contribute to dizziness - if NO abnormal labs we can consider reduce thiazide HCTZ 25mg  component can reduce to 12.5 or off in future.  Encourage improved lifestyle - low sodium diet, regular exercise Continue monitor BP outside office, bring readings to next visit, if persistently >140/90 or new symptoms notify office sooner      Relevant Orders   CBC with Differential/Platelet   COMPLETE METABOLIC PANEL  WITH GFR   Anemia   Relevant Orders   CBC with Differential/Platelet   Iron, TIBC and Ferritin Panel   Folate    Other Visit Diagnoses    Vitamin D deficiency       Relevant Orders   VITAMIN D 25 Hydroxy (Vit-D Deficiency, Fractures)   Intermittent pain and swelling of hand       Relevant Medications   diclofenac Sodium (VOLTAREN) 1 % GEL      Refill Diclofenac  #Dizziness / Fatigue  S/p Bariatric surgery gastric sleeve Reviewed last Bariatric apt Follow-up all results, ordered extensive panel today, as requested, will fax / route to Bariatric as below  Regarding labs drawn today all including vitamins and detailed labs s/p bariatric surgery Fax to [phone] ATTN Claudell Kyle PA - [facility]   Meds ordered this encounter  Medications  . diclofenac Sodium (VOLTAREN) 1 % GEL    Sig: Apply 2 g topically 3 (three) times daily as needed.    Dispense:  100 g    Refill:  2      Follow up plan: Return in about 6 months (around 10/16/2020) for 6 month follow-up HTN.   Saralyn Pilar, DO [facility] 04/18/2020, 8:59 AM

## 2020-04-20 LAB — COMPLETE METABOLIC PANEL WITH GFR: BUN/Creatinine Ratio: 25 (calc) — ABNORMAL HIGH (ref 6–22)

## 2020-04-25 LAB — LIPID PANEL: Total CHOL/HDL Ratio: 3.7 (calc) (ref <5.0)

## 2020-05-01 LAB — VITAMIN A: Vitamin A (Retinoic Acid): 39 ug/dL (ref 38–98)

## 2020-05-01 LAB — COMPLETE METABOLIC PANEL WITH GFR
AG Ratio: 1.2 (calc) (ref 1.0–2.5)
ALT: 19 U/L (ref 6–29)
AST: 23 U/L (ref 10–35)
Albumin: 4.4 g/dL (ref 3.6–5.1)
Alkaline phosphatase (APISO): 95 U/L (ref 37–153)
BUN: 26 mg/dL — ABNORMAL HIGH (ref 7–25)
CO2: 30 mmol/L (ref 20–32)
Calcium: 10.2 mg/dL (ref 8.6–10.4)
Chloride: 104 mmol/L (ref 98–110)
Creat: 1.03 mg/dL (ref 0.50–1.05)
GFR, Est African American: 70 mL/min/{1.73_m2} (ref >=60)
GFR, Est Non African American: 61 mL/min/{1.73_m2} (ref >=60)
Globulin: 3.8 g/dL (calc) — ABNORMAL HIGH (ref 1.9–3.7)
Glucose, Bld: 65 mg/dL (ref 65–99)
Potassium: 4.1 mmol/L (ref 3.5–5.3)
Sodium: 140 mmol/L (ref 135–146)
Total Bilirubin: 0.4 mg/dL (ref 0.2–1.2)
Total Protein: 8.2 g/dL — ABNORMAL HIGH (ref 6.1–8.1)

## 2020-05-01 LAB — FOLATE: Folate: 4.6 ng/mL — ABNORMAL LOW

## 2020-05-01 LAB — CBC WITH DIFFERENTIAL/PLATELET
Absolute Monocytes: 332 cells/uL (ref 200–950)
Basophils Absolute: 41 cells/uL (ref 0–200)
Basophils Relative: 0.8 %
Eosinophils Absolute: 92 cells/uL (ref 15–500)
Eosinophils Relative: 1.8 %
HCT: 38.7 % (ref 35.0–45.0)
Lymphs Abs: 1535 cells/uL (ref 850–3900)
MCH: 28.1 pg (ref 27.0–33.0)
MCHC: 31.3 g/dL — ABNORMAL LOW (ref 32.0–36.0)
MCV: 89.8 fL (ref 80.0–100.0)
MPV: 12.4 fL (ref 7.5–12.5)
Monocytes Relative: 6.5 %
Neutro Abs: 3101 cells/uL (ref 1500–7800)
Neutrophils Relative %: 60.8 %
Platelets: 243 10*3/uL (ref 140–400)
RBC: 4.31 10*6/uL (ref 3.80–5.10)
RDW: 12.7 % (ref 11.0–15.0)
Total Lymphocyte: 30.1 %
WBC: 5.1 10*3/uL (ref 3.8–10.8)

## 2020-05-01 LAB — IRON,TIBC AND FERRITIN PANEL
%SAT: 24 % (calc) (ref 16–45)
Ferritin: 114 ng/mL (ref 16–232)
Iron: 73 ug/dL (ref 45–160)
TIBC: 303 mcg/dL (calc) (ref 250–450)

## 2020-05-01 LAB — VITAMIN K1, SERUM: Vitamin K: 1463 pg/mL (ref 130–1500)

## 2020-05-01 LAB — PTH, INTACT AND CALCIUM
Calcium: 10.2 mg/dL (ref 8.6–10.4)
PTH: 45 pg/mL (ref 14–64)

## 2020-05-01 LAB — LIPID PANEL
Cholesterol: 175 mg/dL (ref <200)
HDL: 47 mg/dL — ABNORMAL LOW (ref >=50)
LDL Cholesterol (Calc): 111 mg/dL (calc) — ABNORMAL HIGH
Non-HDL Cholesterol (Calc): 128 mg/dL (calc) (ref <130)
Triglycerides: 78 mg/dL (ref <150)

## 2020-05-01 LAB — VITAMIN D 25 HYDROXY (VIT D DEFICIENCY, FRACTURES): Vit D, 25-Hydroxy: 35 ng/mL (ref 30–100)

## 2020-05-01 LAB — PHOSPHORUS: Phosphorus: 3.7 mg/dL (ref 2.5–4.5)

## 2020-05-01 LAB — COPPER, SERUM: Copper: 115 ug/dL (ref 70–175)

## 2020-05-01 LAB — ZINC: Zinc: 59 ug/dL — ABNORMAL LOW (ref 60–130)

## 2020-05-01 LAB — VITAMIN B12: Vitamin B-12: 656 pg/mL (ref 200–1100)

## 2020-05-01 LAB — MAGNESIUM: Magnesium: 1.9 mg/dL (ref 1.5–2.5)

## 2020-05-01 LAB — VITAMIN B1: Vitamin B1 (Thiamine): 10 nmol/L (ref 8–30)

## 2020-08-21 ENCOUNTER — Telehealth (INDEPENDENT_AMBULATORY_CARE_PROVIDER_SITE_OTHER): Payer: Self-pay | Admitting: Family Medicine

## 2020-08-21 ENCOUNTER — Encounter: Payer: Self-pay | Admitting: Family Medicine

## 2020-08-21 ENCOUNTER — Other Ambulatory Visit: Payer: Self-pay

## 2020-08-21 VITALS — Ht 60.0 in | Wt 160.0 lb

## 2020-08-21 DIAGNOSIS — U071 COVID-19: Secondary | ICD-10-CM

## 2020-08-21 MED ORDER — MOLNUPIRAVIR EUA 200MG CAPSULE: 4.0000 | ORAL_CAPSULE | Freq: Two times a day (BID) | ORAL | 0 refills | Status: AC

## 2020-08-21 MED ORDER — BENZONATATE 100 MG PO CAPS: 100.0000 mg | ORAL_CAPSULE | Freq: Three times a day (TID) | ORAL | 0 refills | Status: DC | PRN

## 2020-08-21 NOTE — Progress Notes (Signed)
Virtual Visit via Telephone The purpose of this virtual visit is to provide medical care while limiting exposure to the novel coronavirus (COVID19) for both patient and office staff.  Consent was obtained for phone visit:  Yes.   Answered questions that patient had about telehealth interaction:  Yes.   I discussed the limitations, risks, security and privacy concerns of performing an evaluation and management service by telephone. I also discussed with the patient that there may be a patient responsible charge related to this service. The patient expressed understanding and agreed to proceed.  Patient Location: Home Provider Location: [facility] (Office)  Participants in virtual visit: - Patient: Ashley Terry - CMA: Orinda Kenner, CMA - Provider: Dr Parks Ranger  ---------------------------------------------------------------------- Chief Complaint  Patient presents with   Covid Positive   Headache   chest congestion    S: Reviewed CMA documentation. I have called patient and gathered additional HPI as follows:  COVID19 Infection Reports that symptoms started with headache on Saturday 08/19/20 and she felt bad Saturday night with not feeling well and tired and fatigued. She did improve then worsened on Sunday and she had runny nose and felt more tired. She tested positive for COVID19 yesterday Sunday 08/20/20. - Admits some return of dry hacking cough. - Admits chest congestion Updated on vaccines Moderna x 2 doses 04/2020 and 05/2020, and x 2 booster doses already, most recent 4th vaccine / 2nd dose was done within past 6-8 weeks.  Needs work note, able to work from home.  Patient is currently in isolation but may be able to work from.  Denies any fevers, chills, sweats, body ache, cough, shortness of breath, abdominal pain, diarrhea  Past Medical History:  Diagnosis Date   Asthma    Dyspnea    with exertion   Frequent headaches    GERD (gastroesophageal  reflux disease)    takes protonix   Seasonal allergies    Sleep apnea    CPAP   Social History   Tobacco Use   Smoking status: Never   Smokeless tobacco: Never  Substance Use Topics   Alcohol use: No   Drug use: No    Current Outpatient Medications:    acetaminophen (TYLENOL) 500 MG tablet, Take 500 mg by mouth every 6 (six) hours as needed for moderate pain., Disp: , Rfl:    benzonatate (TESSALON) 100 MG capsule, Take 1 capsule (100 mg total) by mouth 3 (three) times daily as needed for cough., Disp: 30 capsule, Rfl: 0   diclofenac Sodium (VOLTAREN) 1 % GEL, Apply 2 g topically 3 (three) times daily as needed., Disp: 100 g, Rfl: 2   lisinopril-hydrochlorothiazide (ZESTORETIC) 20-25 MG tablet, Take 1 tablet by mouth daily., Disp: 90 tablet, Rfl: 3   molnupiravir EUA 200 mg CAPS, Take 4 capsules (800 mg total) by mouth 2 (two) times daily for 5 days., Disp: 40 capsule, Rfl: 0  Depression screen [facility] 2/9 12/17/2019 02/22/2019 04/24/2018  Decreased Interest 0 0 0  Down, Depressed, Hopeless 0 0 0  PHQ - 2 Score 0 0 0    No flowsheet data found.  -------------------------------------------------------------------------- O: No physical exam performed due to remote telephone encounter.  Lab results reviewed.  No results found for this or any previous visit (from the past 2160 hour(s)).  -------------------------------------------------------------------------- A&P:  Problem List Items Addressed This Visit   None Visit Diagnoses     COVID-19 virus infection    -  Primary   Relevant Medications  molnupiravir EUA 200 mg CAPS   benzonatate (TESSALON) 100 MG capsule      COVID19 positive Symptom 1st onset 08/19/20 Confirm home test positive 08/20/20 Mild to moderate symptoms currently. No red flags or dyspnea Risk factors type 2 diabetes, HTN CKD, Obesity   Start Molnpiravir 235m x 4 = 8048mBID x 5 days, rx sent Counseling provided Reviewed benefits risks potential side  effects Not order Paxlovid due to CKDII-III in past 8 months on prior lab, most recent was normal range eGFR >60 however prior result was <60.  Work note given, can work from home. Follow-up criteria given.   Meds ordered this encounter  Medications   molnupiravir EUA 200 mg CAPS    Sig: Take 4 capsules (800 mg total) by mouth 2 (two) times daily for 5 days.    Dispense:  40 capsule    Refill:  0   benzonatate (TESSALON) 100 MG capsule    Sig: Take 1 capsule (100 mg total) by mouth 3 (three) times daily as needed for cough.    Dispense:  30 capsule    Refill:  0     Follow-up: - Return in 1-2 week PRN COVID if not improved  Patient verbalizes understanding with the above medical recommendations including the limitation of remote medical advice.  Specific follow-up and call-back criteria were given for patient to follow-up or seek medical care more urgently if needed.   - Time spent in direct consultation with patient on phone: 12 minutes   AlNobie PutnamDOFriendship [place] Vill[place] 08/21/2020, 8:40 AM

## 2020-08-21 NOTE — Patient Instructions (Addendum)
Thank you for coming to the office today.  Start COVID medication today 5 day course. Start Tessalon Perls take 1 capsule up to 3 times a day as needed for cough Work note on [employer]  Please schedule a Follow-up Appointment to: No follow-ups on file.  If you have any other questions or concerns, please feel free to call the office or send a message through MyChart. You may also schedule an earlier appointment if necessary.  Additionally, you may be receiving a survey about your experience at our office within a few days to 1 week by e-mail or mail. We value your feedback.  Saralyn Pilar, DO [facility], [place]

## 2020-10-16 ENCOUNTER — Ambulatory Visit: Payer: Self-pay | Admitting: Family Medicine

## 2020-10-25 ENCOUNTER — Ambulatory Visit: Payer: Self-pay | Admitting: Family Medicine

## 2020-10-27 ENCOUNTER — Ambulatory Visit: Payer: Self-pay | Admitting: Family Medicine

## 2020-11-01 ENCOUNTER — Telehealth (INDEPENDENT_AMBULATORY_CARE_PROVIDER_SITE_OTHER): Payer: Self-pay | Admitting: Family Medicine

## 2020-11-01 ENCOUNTER — Encounter: Payer: Self-pay | Admitting: Family Medicine

## 2020-11-01 VITALS — Ht 60.0 in | Wt 155.0 lb

## 2020-11-01 DIAGNOSIS — B372 Candidiasis of skin and nail: Secondary | ICD-10-CM

## 2020-11-01 DIAGNOSIS — I129 Hypertensive chronic kidney disease with stage 1 through stage 4 chronic kidney disease, or unspecified chronic kidney disease: Secondary | ICD-10-CM

## 2020-11-01 DIAGNOSIS — N183 Chronic kidney disease, stage 3 unspecified: Secondary | ICD-10-CM

## 2020-11-01 MED ORDER — HYDROCHLOROTHIAZIDE 12.5 MG PO TABS
12.5000 mg | ORAL_TABLET | Freq: Every day | ORAL | 1 refills | Status: DC
Start: 2020-11-01 — End: 2021-05-18

## 2020-11-01 MED ORDER — NYSTATIN 100000 UNIT/GM EX POWD
1.0000 | Freq: Three times a day (TID) | CUTANEOUS | 3 refills | Status: DC | PRN
Start: 2020-11-01 — End: 2021-10-16

## 2020-11-01 MED ORDER — LISINOPRIL 20 MG PO TABS: 20.0000 mg | ORAL_TABLET | Freq: Every day | ORAL | 1 refills | Status: DC

## 2020-11-01 NOTE — Assessment & Plan Note (Signed)
Remains controlled BP, still lower BP than average on meds w/ wt loss HTN controlled - post bariatric surgery Complicated by CKD-III-IV - progressive decline in kidney function now OFF Amlodipine  Concern thiazide making her dizzy lightheaded / with exercise sweating fluid loss   Plan:  REDUCE dose HCTZ from 25 to 12.5mg  daily. Split dose into separate pills not combo Lisinopril-HCTZ anymore, new rx Lisinopril 20mg  daily and HCTZ 12.5mg  now   Encourage improved lifestyle - low sodium diet, regular exercise Continue monitor BP outside office, bring readings to next visit, if persistently >140/90 or new symptoms notify office sooner

## 2020-11-01 NOTE — Progress Notes (Signed)
Virtual Visit via Telephone The purpose of this virtual visit is to provide medical care while limiting exposure to the novel coronavirus (COVID19) for both patient and office staff.  Consent was obtained for phone visit:  Yes.   Answered questions that patient had about telehealth interaction:  Yes.   I discussed the limitations, risks, security and privacy concerns of performing an evaluation and management service by telephone. I also discussed with the patient that there may be a patient responsible charge related to this service. The patient expressed understanding and agreed to proceed.  Patient Location: Home Provider Location: [facility] (Office)  Participants in virtual visit: - Patient: Ashley Terry - CMA: Burnell Blanks, CMA - Provider: Dr Althea Charon  ---------------------------------------------------------------------- Chief Complaint  Patient presents with   Diabetes   Hypertension    S: Reviewed CMA documentation. I have called patient and gathered additional HPI as follows:  Morbid Obesity, BMI >35 Followed by [facility] S/p Lap Gastric Sleeve - Dr Charlton Amor - [facility] on 12/21/18 Weight loss about 100 lbs in 1 year She has done well with continued weight loss. She has completed labs with vitamin testing last time.   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 Occasionally feels dizziness, unsure if BP or if not eating >3 hours.  She still has same problem, she is interested in med adjustment Seems lightheaded is ONLY after strenuous exercise activity Current Meds - Lisinopril-HCTZ 20-25mg  daily - still taking. Reports good compliance, took meds today. Tolerating well, w/o complaints. Lifestyle: - Diet: staying hydrated. Still limited diet. Admits edema lower extremity Denies CP, dyspnea, HA,  lightheadedness   OSA No longer on CPAP after weight loss bariatric  Candidal Intertrigo Reports recurrent areas of irritated skin in skin folds with sweating, previously on Nystatin powder with resolution.  Past Medical History:  Diagnosis Date   Asthma    Dyspnea    with exertion   Frequent headaches    GERD (gastroesophageal reflux disease)    takes protonix   Seasonal allergies    Sleep apnea    CPAP   Social History   Tobacco Use   Smoking status: Never   Smokeless tobacco: Never  Substance Use Topics   Alcohol use: No   Drug use: No    Current Outpatient Medications:    acetaminophen (TYLENOL) 500 MG tablet, Take 500 mg by mouth every 6 (six) hours as needed for moderate pain., Disp: , Rfl:    benzonatate (TESSALON) 100 MG capsule, Take 1 capsule (100 mg total) by mouth 3 (three) times daily as needed for cough., Disp: 30 capsule, Rfl: 0   diclofenac Sodium (VOLTAREN) 1 % GEL, Apply 2 g topically 3 (three) times daily as needed., Disp: 100 g, Rfl: 2   hydrochlorothiazide (HYDRODIURIL) 12.5 MG tablet, Take 1 tablet (12.5 mg total) by mouth daily., Disp: 90 tablet, Rfl: 1   lisinopril (ZESTRIL) 20 MG tablet, Take 1 tablet (20 mg total) by mouth daily., Disp: 90 tablet, Rfl: 1   nystatin (MYCOSTATIN/NYSTOP) powder, Apply 1 application topically 3 (three) times daily as needed., Disp: 30 g, Rfl: 3  Depression screen [facility] 2/9 12/17/2019 02/22/2019 04/24/2018  Decreased Interest 0 0 0  Down, Depressed, Hopeless 0 0 0  PHQ - 2 Score 0 0 0    No flowsheet data found.  -------------------------------------------------------------------------- O: No physical exam  performed due to remote telephone encounter.  Lab results reviewed.  No results found for this or any previous visit (from the past 2160 hour(s)).  -------------------------------------------------------------------------- A&P:  Problem List Items Addressed This Visit     Benign hypertension with CKD  (chronic kidney disease) stage III (HCC) - Primary    Remains controlled BP, still lower BP than average on meds w/ wt loss HTN controlled - post bariatric surgery Complicated by CKD-III-IV - progressive decline in kidney function now OFF Amlodipine  Concern thiazide making her dizzy lightheaded / with exercise sweating fluid loss   Plan:  REDUCE dose HCTZ from 25 to 12.5mg  daily. Split dose into separate pills not combo Lisinopril-HCTZ anymore, new rx Lisinopril 20mg  daily and HCTZ 12.5mg  now   Encourage improved lifestyle - low sodium diet, regular exercise Continue monitor BP outside office, bring readings to next visit, if persistently >140/90 or new symptoms notify office sooner      Relevant Medications   lisinopril (ZESTRIL) 20 MG tablet   hydrochlorothiazide (HYDRODIURIL) 12.5 MG tablet   Other Visit Diagnoses     Candidal intertrigo       Relevant Medications   nystatin (MYCOSTATIN/NYSTOP) powder      subacute flare, skin folds  Prior episode resolved w/ Nystatin powder  Plan: 1. Start Nystatin powder TID for 2-4 weeks 2. Can use topical cortisone PRN if need Keep dry, open to air some Follow-up 2-3 weeks if not improved, consider alternative anti fungal topical   Meds ordered this encounter  Medications   lisinopril (ZESTRIL) 20 MG tablet    Sig: Take 1 tablet (20 mg total) by mouth daily.    Dispense:  90 tablet    Refill:  1   hydrochlorothiazide (HYDRODIURIL) 12.5 MG tablet    Sig: Take 1 tablet (12.5 mg total) by mouth daily.    Dispense:  90 tablet    Refill:  1   nystatin (MYCOSTATIN/NYSTOP) powder    Sig: Apply 1 application topically 3 (three) times daily as needed.    Dispense:  30 g    Refill:  3    Follow-up:  Patient verbalizes understanding with the above medical recommendations including the limitation of remote medical advice.  Specific follow-up and call-back criteria were given for patient to follow-up or seek medical care more  urgently if needed.   - Time spent in direct consultation with patient on phone: 15 minutes   , DO [facility] 11/01/2020, 11:35 AM

## 2020-11-01 NOTE — Patient Instructions (Addendum)
Reduce dose HCTZ from 25 to 12.5mg  daily   Please schedule a Follow-up Appointment to: Return in about 3 months (around 01/31/2021) for 3 month HTN, DM A1c, edema/lightheaded med manage.  If you have any other questions or concerns, please feel free to call the office or send a message through MyChart. You may also schedule an earlier appointment if necessary.  Additionally, you may be receiving a survey about your experience at our office within a few days to 1 week by e-mail or mail. We value your feedback.  Saralyn Pilar, DO [facility], [place]

## 2021-02-21 ENCOUNTER — Ambulatory Visit: Payer: BC Managed Care – PPO | Admitting: Family Medicine

## 2021-02-21 ENCOUNTER — Other Ambulatory Visit: Payer: Self-pay

## 2021-02-21 ENCOUNTER — Encounter: Payer: Self-pay | Admitting: Family Medicine

## 2021-02-21 VITALS — BP 117/62 | HR 62 | Temp 98.3°F | Ht 60.0 in | Wt 164.0 lb

## 2021-02-21 DIAGNOSIS — Z23 Encounter for immunization: Secondary | ICD-10-CM | POA: Diagnosis not present

## 2021-02-21 DIAGNOSIS — L120 Bullous pemphigoid: Secondary | ICD-10-CM

## 2021-02-21 MED ORDER — CLOBETASOL PROPIONATE 0.05 % EX OINT: 1.0000 "application " | TOPICAL_OINTMENT | Freq: Two times a day (BID) | CUTANEOUS | 1 refills | Status: AC

## 2021-02-21 MED ORDER — DOXYCYCLINE HYCLATE 100 MG PO TABS
100.0000 mg | ORAL_TABLET | Freq: Two times a day (BID) | ORAL | 0 refills | Status: DC
Start: 2021-02-21 — End: 2021-10-16

## 2021-02-21 NOTE — Progress Notes (Signed)
Subjective:    Patient ID: Ashley Terry, female    DOB: 01-Jun-1965, 55 y.o.   MRN: [id number]  Ashley Terry is a 55 y.o. female presenting on 02/21/2021 for Blister (X2-3 weeks, Both thighs, raised filled with fluid, painful, redness, scabbing once popped, hard to drive and sit in chairs) and Flu Vaccine   HPI  Bullous Pemphigoid Rash 2-3 weeks onset with painful blister rash on inner bilateral thighs. No new topicals or creams or medicines or products to trigger. Various stages of healing. Some slightly tense blister and others pop and drain clear fluid, some odor to it. No push or erythema spreading. No fevers. Some pain with burning and also a lot of itching with rash.  Health Maintenance:  Due for Flu Shot, will receive today    Depression screen [facility] 2/9 02/21/2021 12/17/2019 02/22/2019  Decreased Interest 0 0 0  Down, Depressed, Hopeless 0 0 0  PHQ - 2 Score 0 0 0  Altered sleeping 1 - -  Tired, decreased energy 1 - -  Change in appetite 2 - -  Feeling bad or failure about yourself  0 - -  Trouble concentrating 0 - -  Moving slowly or fidgety/restless 0 - -  Suicidal thoughts 0 - -  PHQ-9 Score 4 - -  Difficult doing work/chores Not difficult at all - -    Social History   Tobacco Use   Smoking status: Never   Smokeless tobacco: Never  Substance Use Topics   Alcohol use: No   Drug use: No    Review of Systems Per HPI unless specifically indicated above     Objective:    BP 117/62    Pulse 62    Temp 98.3 F (36.8 C) (Oral)    Ht 5' (1.524 m)    Wt 164 lb (74.4 kg)    SpO2 100%    BMI 32.03 kg/m   Wt Readings from Last 3 Encounters:  02/21/21 164 lb (74.4 kg)  11/01/20 155 lb (70.3 kg)  08/21/20 160 lb (72.6 kg)    Physical Exam Vitals and nursing note reviewed.  Constitutional:      General: She is not in acute distress.    Appearance: Normal appearance. She is well-developed. She is not diaphoretic.     Comments: Well-appearing, comfortable,  cooperative  HENT:     Head: Normocephalic and atraumatic.  Eyes:     General:        Right eye: No discharge.        Left eye: No discharge.     Conjunctiva/sclera: Conjunctivae normal.  Cardiovascular:     Rate and Rhythm: Normal rate.  Pulmonary:     Effort: Pulmonary effort is normal.  Skin:    General: Skin is warm and dry.     Findings: Rash (various stages healing localized area on both lower extremities inner thighs with various blisters some open ulceration with slough and others slightly tense blsiter - also reviewed pictures from course of her rash.) present. No erythema.  Neurological:     Mental Status: She is alert and oriented to person, place, and time.  Psychiatric:        Mood and Affect: Mood normal.        Behavior: Behavior normal.        Thought Content: Thought content normal.     Comments: Well groomed, good eye contact, normal speech and thoughts     Results for orders placed or performed in  visit on 04/18/20  CBC with Differential/Platelet  Result Value Ref Range   WBC 5.1 3.8 - 10.8 Thousand/uL   RBC 4.31 3.80 - 5.10 Million/uL   Hemoglobin 12.1 11.7 - 15.5 g/dL   HCT 55.7 32.2 - 02.5 %   MCV 89.8 80.0 - 100.0 fL   MCH 28.1 27.0 - 33.0 pg   MCHC 31.3 (L) 32.0 - 36.0 g/dL   RDW 42.7 06.2 - 37.6 %   Platelets 243 140 - 400 Thousand/uL   MPV 12.4 7.5 - 12.5 fL   Neutro Abs 3,101 1,500 - 7,800 cells/uL   Lymphs Abs 1,535 850 - 3,900 cells/uL   Absolute Monocytes 332 200 - 950 cells/uL   Eosinophils Absolute 92 15 - 500 cells/uL   Basophils Absolute 41 0 - 200 cells/uL   Neutrophils Relative % 60.8 %   Total Lymphocyte 30.1 %   Monocytes Relative 6.5 %   Eosinophils Relative 1.8 %   Basophils Relative 0.8 %  COMPLETE METABOLIC PANEL WITH GFR  Result Value Ref Range   Glucose, Bld 65 65 - 99 mg/dL   BUN 26 (H) 7 - 25 mg/dL   Creat 2.83 1.51 - 7.61 mg/dL   GFR, Est Non African American 61 > OR = 60 mL/min/1.26m2   GFR, Est African American 70  > OR = 60 mL/min/1.29m2   BUN/Creatinine Ratio 25 (H) 6 - 22 (calc)   Sodium 140 135 - 146 mmol/L   Potassium 4.1 3.5 - 5.3 mmol/L   Chloride 104 98 - 110 mmol/L   CO2 30 20 - 32 mmol/L   Calcium 10.2 8.6 - 10.4 mg/dL   Total Protein 8.2 (H) 6.1 - 8.1 g/dL   Albumin 4.4 3.6 - 5.1 g/dL   Globulin 3.8 (H) 1.9 - 3.7 g/dL (calc)   AG Ratio 1.2 1.0 - 2.5 (calc)   Total Bilirubin 0.4 0.2 - 1.2 mg/dL   Alkaline phosphatase (APISO) 95 37 - 153 U/L   AST 23 10 - 35 U/L   ALT 19 6 - 29 U/L  Lipid panel  Result Value Ref Range   Cholesterol 175 <200 mg/dL   HDL 47 (L) > OR = 50 mg/dL   Triglycerides 78 <607 mg/dL   LDL Cholesterol (Calc) 111 (H) mg/dL (calc)   Total CHOL/HDL Ratio 3.7 <5.0 (calc)   Non-HDL Cholesterol (Calc) 128 <130 mg/dL (calc)  VITAMIN D 25 Hydroxy (Vit-D Deficiency, Fractures)  Result Value Ref Range   Vit D, 25-Hydroxy 35 30 - 100 ng/mL  PTH, Intact and Calcium  Result Value Ref Range   PTH 45 14 - 64 pg/mL   Calcium 10.2 8.6 - 10.4 mg/dL  Iron, TIBC and Ferritin Panel  Result Value Ref Range   Iron 73 45 - 160 mcg/dL   TIBC 371 062 - 694 mcg/dL (calc)   %SAT 24 16 - 45 % (calc)   Ferritin 114 16 - 232 ng/mL  Copper, serum  Result Value Ref Range   Copper 115 70 - 175 mcg/dL  Folate  Result Value Ref Range   Folate 4.6 (L) ng/mL  Magnesium  Result Value Ref Range   Magnesium 1.9 1.5 - 2.5 mg/dL  Phosphorus  Result Value Ref Range   Phosphorus 3.7 2.5 - 4.5 mg/dL  Vitamin A  Result Value Ref Range   Vitamin A (Retinoic Acid) 39 38 - 98 mcg/dL  Vitamin B1  Result Value Ref Range   Vitamin B1 (Thiamine) 10 8 - 30 nmol/L  Vitamin B12  Result Value Ref Range   Vitamin B-12 656 200 - 1,100 pg/mL  Vitamin K1, Serum  Result Value Ref Range   Vitamin K 1,463 130 - 1,500 pg/mL  Zinc  Result Value Ref Range   Zinc 59 (L) 60 - 130 mcg/dL  POCT HgB Z6X  Result Value Ref Range   Hemoglobin A1C 4.8 4.0 - 5.6 %      Assessment & Plan:   Problem List  Items Addressed This Visit   None Visit Diagnoses     Bullous pemphigoid    -  Primary   Relevant Medications   doxycycline (VIBRA-TABS) 100 MG tablet   clobetasol ointment (TEMOVATE) 0.05 %   Needs flu shot       Relevant Orders   Flu Vaccine QUAD 85mo+IM (Fluarix, Fluzone & Alfiuria Quad PF) (Completed)       Suspected bullous pemphigoid based on clinical appearance of blistering localized rash on lower extremities bilateral. No known trigger. Clinically no evidence of secondary bacterial infection, cellulitis or abscess today.  Plan: 1. Start high potency topical steroid Clobetasol 0.05% ointment BID for up to 1-2 weeks 2. Also given empiric oral antibiotic - Start taking Doxycycline antibiotic 100mg  twice daily for 10 days. Take with full glass of water and stay upright for at least 30 min after taking, may be seated or standing, but should NOT lay down. This is just a safety precaution, if this medicine does not go all the way down throat well it could cause some burning discomfort to throat and esophagus.  Routine skin care for open ulcerations, wash, keep dry, can keep covered vaseline or ointment for protection  If unresolved would refer to Dermatologist next.  Strict return criteria given if dramatic worsening spreading rash, sign of infection or systemic illness   Meds ordered this encounter  Medications   doxycycline (VIBRA-TABS) 100 MG tablet    Sig: Take 1 tablet (100 mg total) by mouth 2 (two) times daily. For 10 days. Take with full glass of water, stay upright 30 min after taking.    Dispense:  20 tablet    Refill:  0   clobetasol ointment (TEMOVATE) 0.05 %    Sig: Apply 1 application topically 2 (two) times daily. For up to 2 weeks, or until healed. May repeat course.    Dispense:  30 g    Refill:  1     Follow up plan: Return if symptoms worsen or fail to improve, for 2-4 weeks rash bullous pemphigoid, refer to Derm if needed.   ,  DO [facility] 02/21/2021, 9:20 AM

## 2021-02-21 NOTE — Patient Instructions (Addendum)
Thank you for coming to the office today.   Start taking Doxycycline antibiotic 100mg  twice daily for 10 days. Take with full glass of water and stay upright for at least 30 min after taking, may be seated or standing, but should NOT lay down. This is just a safety precaution, if this medicine does not go all the way down throat well it could cause some burning discomfort to throat and esophagus.  Use topical clobetasol ointment to help heal and reduce itch for twice a day 2 weeks then can repeat in future.    Bullous Pemphigoid Bullous pemphigoid is a skin disease that causes blisters to form. It ranges in severity and can last for a long time. The disease can come back months or years after it goes away. Bullous pemphigoid is an autoimmune disease. This means that the body's own disease-fighting system (immune system) attacks the body.  What are the causes? The cause of this condition is not known. Certain medicines and conditions, such as psoriasis, lichen planus, and multiple sclerosis, have been associated with bullous pemphigoid. What increases the risk? This condition is more likely to develop in people over the age of 31. What are the signs or symptoms? This condition causes blisters to form on the skin. In mild cases, only a few small blisters form. In severe cases, many large blisters form in several areas of the body. The most common places blisters form are the groin, armpits, torso, thighs, and forearms. Some people develop blisters in the mouth. The blisters may break open, forming ulcers. Other symptoms of this condition include: Redness. Irritation. Itchiness. Bleeding gums. Difficulty eating. Cough. Pain with swallowing. Nosebleeds. How is this diagnosed? This condition may be diagnosed with a physical exam and blood tests. You may have a skin sample removed for testing (skin biopsy) to confirm diagnosis. You may work with a health care provider who specializes in skin  care (dermatologist). How is this treated? This condition may be managed with medicines, such as: Antibiotic medicines to prevent infection. Steroid medicines to reduce inflammation. These may be applied to the skin (topical), taken by mouth (oral), or given as injections. Medicines that reduce the activity of (suppress) the immune system. If your mouth or lips are affected, your health care provider may recommend changing your diet while you are having symptoms. If your symptoms are severe, you may need to be treated at the hospital. Treatment at the hospital may include: Treatment for ulcers. IV medicines. IV nutrition, if your mouth or lips are affected. Follow these instructions at home: Take over-the-counter and prescription medicines only as told by your health care provider. Keep your skin clean. Do not scratch, pop, or drain your blisters. Doing so can lead to infection. Cover blisters or ulcers with clean bandages until they heal. Change the bandages once a day or as often as recommended by your health care provider. If you have blisters or ulcers in your mouth or lips: Try only drinking liquids or only eating soft foods to help relieve discomfort while eating. Avoid drinking very hot liquids. Keep all follow-up visits as told by your health care provider. This is important. Contact a health care provider if you: Have pain or itchiness that does not get better with medicine. Develop redness, swelling, or pain that spreads away from your blisters or ulcers. Have pus coming from a blister or ulcer. Get help right away if you: Have a fever. Become confused. Have severe pain. Feel unusually tired or weak.  Cannot eat or drink because of blisters, ulcers, or pain in your lips or mouth. Cannot care for yourself because of blisters, ulcers, or pain in your hands or in the soles of your feet. Summary Bullous pemphigoid is a skin disease that causes blisters to form. This an  autoimmune disease, which means that the body's own disease-fighting system (immune system) attacks the body. This condition can be treated with medicines. In some cases, you may need treatment at a hospital. Do not scratch, pop, or drain your blisters. This information is not intended to replace advice given to you by your health care provider. Make sure you discuss any questions you have with your health care provider. Document Revised: 12/07/2019 Document Reviewed: 12/07/2019 Elsevier Patient Education  2022 Elsevier Inc.   Please schedule a Follow-up Appointment to: Return if symptoms worsen or fail to improve, for 2-4 weeks rash bullous pemphigoid, refer to Derm if needed.  If you have any other questions or concerns, please feel free to call the office or send a message through MyChart. You may also schedule an earlier appointment if necessary.  Additionally, you may be receiving a survey about your experience at our office within a few days to 1 week by e-mail or mail. We value your feedback.  Saralyn Pilar, DO [facility], [place]

## 2021-05-16 ENCOUNTER — Other Ambulatory Visit: Payer: Self-pay | Admitting: Family Medicine

## 2021-05-16 DIAGNOSIS — N183 Chronic kidney disease, stage 3 unspecified: Secondary | ICD-10-CM

## 2021-05-17 NOTE — Telephone Encounter (Signed)
Requested medication (s) are due for refill today:   Yes for both ? ?Requested medication (s) are on the active medication list:   Yes for both ? ?Future visit scheduled:   No   Seen 2 mo. ago ? ? ?Last ordered: 11/01/2020 #90, 1 refill for both ? ?Protocol failed because labs are out of date so unable to refill.  ? ?Requested Prescriptions  ?Pending Prescriptions Disp Refills  ? lisinopril (ZESTRIL) 20 MG tablet [Pharmacy Med Name: LISINOPRIL 20 MG TABLET] 90 tablet 1  ?  Sig: TAKE 1 TABLET BY MOUTH EVERY DAY  ?  ? Cardiovascular:  ACE Inhibitors Failed - 05/16/2021  2:13 AM  ?  ?  Failed - Cr in normal range and within 180 days  ?  Creat  ?Date Value Ref Range Status  ?04/18/2020 1.03 0.50 - 1.05 mg/dL Final  ?  Comment:  ?  For patients >21 years of age, the reference limit ?for Creatinine is approximately 13% higher for people ?identified as African-American. ?. ?  ?  ?  ?  ?  Failed - K in normal range and within 180 days  ?  Potassium  ?Date Value Ref Range Status  ?04/18/2020 4.1 3.5 - 5.3 mmol/L Final  ?  ?  ?  ?  Passed - Patient is not pregnant  ?  ?  Passed - Last BP in normal range  ?  BP Readings from Last 1 Encounters:  ?02/21/21 117/62  ?  ?  ?  ?  Passed - Valid encounter within last 6 months  ?  Recent Outpatient Visits   ? ?      ? 2 months ago Bullous pemphigoid  ? [place], DO  ? 6 months ago Benign hypertension with CKD (chronic kidney disease) stage III ([place])  ? [place], DO  ? 8 months ago COVID-19 virus infection  ? [place], DO  ? 1 year ago Controlled type 2 diabetes with neuropathy [facility])  ? [place], DO  ? 1 year ago Annual physical exam  ? [place], DO  ? ?  ?  ? ?  ?  ?  ? hydrochlorothiazide (HYDRODIURIL) 12.5 MG tablet [Pharmacy Med Name: HYDROCHLOROTHIAZIDE 12.5 MG TB] 90  tablet 1  ?  Sig: TAKE 1 TABLET BY MOUTH EVERY DAY  ?  ? Cardiovascular: Diuretics - Thiazide Failed - 05/16/2021  2:13 AM  ?  ?  Failed - Cr in normal range and within 180 days  ?  Creat  ?Date Value Ref Range Status  ?04/18/2020 1.03 0.50 - 1.05 mg/dL Final  ?  Comment:  ?  For patients >13 years of age, the reference limit ?for Creatinine is approximately 13% higher for people ?identified as African-American. ?. ?  ?  ?  ?  ?  Failed - K in normal range and within 180 days  ?  Potassium  ?Date Value Ref Range Status  ?04/18/2020 4.1 3.5 - 5.3 mmol/L Final  ?  ?  ?  ?  Failed - Na in normal range and within 180 days  ?  Sodium  ?Date Value Ref Range Status  ?04/18/2020 140 135 - 146 mmol/L Final  ?  ?  ?  ?  Passed - Last BP in normal range  ?  BP Readings from Last 1 Encounters:  ?  02/21/21 117/62  ?  ?  ?  ?  Passed - Valid encounter within last 6 months  ?  Recent Outpatient Visits   ? ?      ? 2 months ago Bullous pemphigoid  ? [place], DO  ? 6 months ago Benign hypertension with CKD (chronic kidney disease) stage III ([place])  ? [place], DO  ? 8 months ago COVID-19 virus infection  ? [place], DO  ? 1 year ago Controlled type 2 diabetes with neuropathy [facility])  ? [place], DO  ? 1 year ago Annual physical exam  ? [place], DO  ? ?  ?  ? ?  ?  ?  ? ?

## 2021-08-09 ENCOUNTER — Encounter (INDEPENDENT_AMBULATORY_CARE_PROVIDER_SITE_OTHER): Payer: BC Managed Care – PPO | Admitting: Nurse Practitioner

## 2021-08-16 ENCOUNTER — Encounter (INDEPENDENT_AMBULATORY_CARE_PROVIDER_SITE_OTHER): Payer: BC Managed Care – PPO | Admitting: Nurse Practitioner

## 2021-10-09 ENCOUNTER — Encounter (INDEPENDENT_AMBULATORY_CARE_PROVIDER_SITE_OTHER): Payer: BC Managed Care – PPO | Admitting: Vascular Surgery

## 2021-10-12 ENCOUNTER — Encounter (INDEPENDENT_AMBULATORY_CARE_PROVIDER_SITE_OTHER): Payer: BC Managed Care – PPO | Admitting: Vascular Surgery

## 2021-10-16 ENCOUNTER — Ambulatory Visit: Payer: Self-pay

## 2021-10-16 ENCOUNTER — Telehealth: Payer: BC Managed Care – PPO | Admitting: Physician Assistant

## 2021-10-16 ENCOUNTER — Telehealth: Payer: BC Managed Care – PPO | Admitting: Nurse Practitioner

## 2021-10-16 DIAGNOSIS — U071 COVID-19: Secondary | ICD-10-CM

## 2021-10-16 MED ORDER — MOLNUPIRAVIR EUA 200MG CAPSULE: 4.0000 | ORAL_CAPSULE | Freq: Two times a day (BID) | ORAL | 0 refills | Status: AC

## 2021-10-16 MED ORDER — BENZONATATE 100 MG PO CAPS: 100.0000 mg | ORAL_CAPSULE | Freq: Three times a day (TID) | ORAL | 0 refills | Status: AC | PRN

## 2021-10-16 NOTE — Progress Notes (Signed)
Virtual Visit Consent   Ashley Terry, you are scheduled for a virtual visit with a [facility] provider today. Just as with appointments in the office, your consent must be obtained to participate. Your consent will be active for this visit and any virtual visit you may have with one of our providers in the next 365 days. If you have a MyChart account, a copy of this consent can be sent to you electronically.  As this is a virtual visit, video technology does not allow for your provider to perform a traditional examination. This may limit your provider's ability to fully assess your condition. If your provider identifies any concerns that need to be evaluated in person or the need to arrange testing (such as labs, EKG, etc.), we will make arrangements to do so. Although advances in technology are sophisticated, we cannot ensure that it will always work on either your end or our end. If the connection with a video visit is poor, the visit may have to be switched to a telephone visit. With either a video or telephone visit, we are not always able to ensure that we have a secure connection.  By engaging in this virtual visit, you consent to the provision of healthcare and authorize for your insurance to be billed (if applicable) for the services provided during this visit. Depending on your insurance coverage, you may receive a charge related to this service.  I need to obtain your verbal consent now. Are you willing to proceed with your visit today? Ashley Terry has provided verbal consent on 10/16/2021 for a virtual visit (video or telephone). Ashley Simas, FNP  Date: 10/16/2021 12:03 PM  Virtual Visit via Video Note   I, Ashley Terry, connected with  Ashley Terry  ([id number], 01/21/1966) on 10/16/21 at 12:00 PM EDT by a video-enabled telemedicine application and verified that I am speaking with the correct person using two identifiers.  Location: Patient: Virtual Visit Location Patient: Home Provider:  Virtual Visit Location Provider: Home Office   I discussed the limitations of evaluation and management by telemedicine and the availability of in person appointments. The patient expressed understanding and agreed to proceed.    History of Present Illness: Ashley Terry is a 56 y.o. who identifies as a female who was assigned female at birth, and is being seen today after testing positive for COVID -19 at home.  She tested positive last night at home  She has a cough with some burning in her chest that feels like bronchitis that she has had in the past no other symptoms today   Her symptoms started yesterday with fatigue and chills   She has had COVID in the past, last year.  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   Right carpal tunnel syndrome 04/24/2018   Anemia 11/25/2016   Osteoarthritis of left knee 11/25/2016   Biliary colic 04/16/2016   Hyperlipidemia associated with type 2 diabetes mellitus (HCC) 02/01/2016   Chest tightness or pressure 01/22/2016   CKD (chronic kidney disease), stage III (HCC) 01/06/2016   Benign hypertension with CKD (chronic kidney disease) stage III (HCC) 01/05/2016   Controlled type 2 diabetes with neuropathy (HCC) 01/05/2016   OSA (obstructive sleep apnea) 01/05/2016   Morbid obesity (HCC) 01/05/2016   Gastroesophageal reflux  disease without esophagitis 01/05/2016   Chronic headache 01/05/2016   Cholelithiasis 01/05/2016    Allergies: No Known Allergies Medications:  Current Outpatient Medications  Medication Instructions   acetaminophen (TYLENOL) 500 mg, Oral, Every 6 hours PRN   amLODipine (NORVASC) 10 MG tablet No dose, route, or frequency recorded.   Calcium Carbonate-Vit D-Min (CALCIUM 1200 PO) Oral   clobetasol ointment (TEMOVATE) 0.05 % 1  application , Topical, 2 times daily, For up to 2 weeks, or until healed. May repeat course.   diclofenac Sodium (VOLTAREN) 2 g, Topical, 3 times daily PRN   Ferrous Gluconate-C-Folic Acid (IRON-C PO) Oral   Multiple Vitamin (MULTIVITAMIN ADULT PO) 1 capsule, Oral, Daily   Wheat Dextrin (BENEFIBER DRINK MIX PO) Oral     Observations/Objective: Patient is well-developed, well-nourished in no acute distress.  Resting comfortably  at home.  Head is normocephalic, atraumatic.  No labored breathing.  Speech is clear and coherent with logical content.  Patient is alert and oriented at baseline.    Assessment and Plan: 1. COVID-19 Education sent via Mychart   - molnupiravir EUA (LAGEVRIO) 200 mg CAPS capsule; Take 4 capsules (800 mg total) by mouth 2 (two) times daily for 5 days.  Dispense: 40 capsule; Refill: 0 - benzonatate (TESSALON) 100 MG capsule; Take 1 capsule (100 mg total) by mouth 3 (three) times daily as needed.  Dispense: 30 capsule; Refill: 0     Follow Up Instructions: I discussed the assessment and treatment plan with the patient. The patient was provided an opportunity to ask questions and all were answered. The patient agreed with the plan and demonstrated an understanding of the instructions.  A copy of instructions were sent to the patient via MyChart unless otherwise noted below.    The patient was advised to call back or seek an in-person evaluation if the symptoms worsen or if the condition fails to improve as anticipated.  Time:  I spent  15 minutes with the patient via telehealth technology discussing the above problems/concerns.    Ashley Simas, FNP

## 2021-10-16 NOTE — Telephone Encounter (Signed)
FYI.... Pt has a Virtual Visit scheduled for 12 today.   Thanks,   -Vernona Rieger

## 2021-10-16 NOTE — Progress Notes (Signed)
   Thank you for the details you included in the comment boxes. Those details are very helpful in determining the best course of treatment for you and help [place] to provide the best care.Because you have tested positive for COVID and have risk factors that warrant further discussion and potential antiviral medications which we cannot start via just an e-visit, we recommend that you convert this visit to a video visit in order for the provider to better assess what is going on.  The provider will be able to give you a more accurate diagnosis and treatment plan if we can more freely discuss your symptoms and with the addition of a virtual examination.   If you convert to a video visit, we will bill your insurance (similar to an office visit) and you will not be charged for this e-Visit. You will be able to stay at home and speak with the first available [facility] advanced practice provider. The link to do a video visit is in the drop down Menu tab of your Welcome screen in MyChart.

## 2021-10-16 NOTE — Telephone Encounter (Signed)
   Chief Complaint: COVID+ Symptoms: Chills, dry cough and chest pain when coughing Frequency: Yesterday Pertinent Negatives: Patient denies fever, SOB Disposition: [] ED /[] Urgent Care (no appt availability in office) / [x] Appointment(In office/virtual)/ []  [facility]/ [] Home Care/ [] Refused Recommended Disposition /[] [facility]/ []  Follow-up with PCP Additional Notes: Pt tested + for COVID. Pt has chills and dry cough that causes chest pain. Pt does not have a fever. Pt is interested in antiviral medications.  Reason for Disposition  [1] COVID-19 diagnosed by positive lab test (e.g., PCR, rapid self-test kit) AND [2] mild symptoms (e.g., cough, fever, others) AND [4] no complications or SOB  Answer Assessment - Initial Assessment Questions 1. COVID-19 DIAGNOSIS: "How do you know that you have COVID?" (e.g., positive lab test or self-test, diagnosed by doctor or NP/PA, symptoms after exposure).     Last night took test 2. COVID-19 EXPOSURE: "Was there any known exposure to COVID before the symptoms began?" CDC Definition of close contact: within 6 feet (2 meters) for a total of 15 minutes or more over a 24-hour period.      unknown 3. ONSET: "When did the COVID-19 symptoms start?"      Yesterday -  4. WORST SYMPTOM: "What is your worst symptom?" (e.g., cough, fever, shortness of breath, muscle aches)     Cough, chills, chest pain with cough 5. COUGH: "Do you have a cough?" If Yes, ask: "How bad is the cough?"       yes 6. FEVER: "Do you have a fever?" If Yes, ask: "What is your temperature, how was it measured, and when did it start?"     No fever 7. RESPIRATORY STATUS: "Describe your breathing?" (e.g., normal; shortness of breath, wheezing, unable to speak)      Chest pain with cough 8. BETTER-SAME-WORSE: "Are you getting better, staying the same or getting worse compared to yesterday?"  If getting worse, ask, "In what way?"     worse 9. OTHER SYMPTOMS: "Do you  have any other symptoms?"  (e.g., chills, fatigue, headache, loss of smell or taste, muscle pain, sore throat)     Chills, Cough 10. HIGH RISK DISEASE: "Do you have any chronic medical problems?" (e.g., asthma, heart or lung disease, weak immune system, obesity, etc.)       no 11. VACCINE: "Have you had the COVID-19 vaccine?" If Yes, ask: "Which one, how many shots, when did you get it?"       yes 12. PREGNANCY: "Is there any chance you are pregnant?" "When was your last menstrual period?"       na 13. O2 SATURATION MONITOR:  "Do you use an oxygen saturation monitor (pulse oximeter) at home?" If Yes, ask "What is your reading (oxygen level) today?" "What is your usual oxygen saturation reading?" (e.g., 95%)  Protocols used: Coronavirus (COVID-19) Diagnosed or Suspected-A-AH

## 2021-10-19 ENCOUNTER — Encounter (INDEPENDENT_AMBULATORY_CARE_PROVIDER_SITE_OTHER): Payer: BC Managed Care – PPO | Admitting: Vascular Surgery

## 2021-10-30 ENCOUNTER — Encounter (INDEPENDENT_AMBULATORY_CARE_PROVIDER_SITE_OTHER): Payer: BC Managed Care – PPO | Admitting: Vascular Surgery

## 2021-12-11 ENCOUNTER — Encounter (INDEPENDENT_AMBULATORY_CARE_PROVIDER_SITE_OTHER): Payer: BC Managed Care – PPO | Admitting: Vascular Surgery

## 2021-12-24 ENCOUNTER — Encounter (INDEPENDENT_AMBULATORY_CARE_PROVIDER_SITE_OTHER): Payer: Self-pay

## 2022-01-01 ENCOUNTER — Encounter (INDEPENDENT_AMBULATORY_CARE_PROVIDER_SITE_OTHER): Payer: BC Managed Care – PPO | Admitting: Vascular Surgery

## 2022-01-15 ENCOUNTER — Encounter (INDEPENDENT_AMBULATORY_CARE_PROVIDER_SITE_OTHER): Payer: BC Managed Care – PPO | Admitting: Vascular Surgery

## 2022-02-12 ENCOUNTER — Ambulatory Visit (INDEPENDENT_AMBULATORY_CARE_PROVIDER_SITE_OTHER): Payer: BC Managed Care – PPO | Admitting: Vascular Surgery

## 2022-02-12 ENCOUNTER — Encounter (INDEPENDENT_AMBULATORY_CARE_PROVIDER_SITE_OTHER): Payer: Self-pay | Admitting: Vascular Surgery

## 2022-02-12 VITALS — BP 145/82 | HR 60 | Resp 16 | Wt 173.2 lb

## 2022-02-12 DIAGNOSIS — I890 Lymphedema, not elsewhere classified: Secondary | ICD-10-CM

## 2022-02-12 DIAGNOSIS — N183 Chronic kidney disease, stage 3 unspecified: Secondary | ICD-10-CM

## 2022-02-12 DIAGNOSIS — E1140 Type 2 diabetes mellitus with diabetic neuropathy, unspecified: Secondary | ICD-10-CM | POA: Diagnosis not present

## 2022-02-12 DIAGNOSIS — I129 Hypertensive chronic kidney disease with stage 1 through stage 4 chronic kidney disease, or unspecified chronic kidney disease: Secondary | ICD-10-CM | POA: Diagnosis not present

## 2022-02-12 DIAGNOSIS — N1831 Chronic kidney disease, stage 3a: Secondary | ICD-10-CM | POA: Diagnosis not present

## 2022-02-12 NOTE — Progress Notes (Signed)
Patient ID: Ashley Terry, female   DOB: [birth year], 56 y.o.   MRN: [id number]  Chief Complaint  Patient presents with   New Patient (Initial Visit)    Ref Cherylann Ratel consult le edema    HPI Ashley Terry is a 56 y.o. female.  I am asked to see the patient by Dr. Cherylann Ratel for evaluation of lymphedema.  The patient has had longstanding lymphedema for many years.  She recently underwent bariatric surgery and has had more noticeable swelling in her legs.  This is most prominent in the upper calf, knee, and lower thigh area medially bilaterally.  She denies any current open wounds but has had recurring wounds due to her clothes rubbing on the very large pockets over the past several months.  She has been wearing compression socks for many years.  She tries to elevate her legs and stay active.  She has had DVT studies but no functional venous assessment to her knowledge.     Past Medical History:  Diagnosis Date   Asthma    Dyspnea    with exertion   Frequent headaches    GERD (gastroesophageal reflux disease)    takes protonix   Seasonal allergies    Sleep apnea    CPAP    Past Surgical History:  Procedure Laterality Date   CESAREAN SECTION  1980   CESAREAN SECTION  1992   CHOLECYSTECTOMY N/A 04/16/2016   Procedure: LAPAROSCOPIC CHOLECYSTECTOMY;  Surgeon: Lattie Haw, MD;  Location: [facility] ORS;  Service: General;  Laterality: N/A;   TONSILECTOMY, ADENOIDECTOMY, BILATERAL MYRINGOTOMY AND TUBES  1980   TUBAL LIGATION  1994     Family History  Problem Relation Age of Onset   Diabetes Mother    Hypertension Mother    Stroke Father    Cancer Father        Amyloid   Pancreatic cancer Maternal Grandmother    Breast cancer Paternal Grandmother    Prostate cancer Maternal Grandfather    Cancer Paternal Grandfather        unknown      Social History   Tobacco Use   Smoking status: Never   Smokeless tobacco: Never  Substance Use Topics   Alcohol use: No   Drug use: No      No Known Allergies  Current Outpatient Medications  Medication Sig Dispense Refill   acetaminophen (TYLENOL) 500 MG tablet Take 500 mg by mouth every 6 (six) hours as needed for moderate pain.     amLODipine (NORVASC) 10 MG tablet      benzonatate (TESSALON) 100 MG capsule Take 1 capsule (100 mg total) by mouth 3 (three) times daily as needed. 30 capsule 0   Calcium Carbonate-Vit D-Min (CALCIUM 1200 PO) Take by mouth.     clobetasol ointment (TEMOVATE) 0.05 % Apply 1 application topically 2 (two) times daily. For up to 2 weeks, or until healed. May repeat course. 30 g 1   diclofenac Sodium (VOLTAREN) 1 % GEL Apply 2 g topically 3 (three) times daily as needed. 100 g 2   Ferrous Gluconate-C-Folic Acid (IRON-C PO) Take by mouth.     Multiple Vitamin (MULTIVITAMIN ADULT PO) Take 1 capsule by mouth daily.     Wheat Dextrin (BENEFIBER DRINK MIX PO) Take by mouth.     No current facility-administered medications for this visit.      REVIEW OF SYSTEMS (Negative unless checked)  Constitutional: [x] Weight loss  [] Fever  [] Chills Cardiac: [] Chest pain   [] Chest  pressure   [] Palpitations   [] Shortness of breath when laying flat   [] Shortness of breath at rest   [] Shortness of breath with exertion. Vascular:  [] Pain in legs with walking   [] Pain in legs at rest   [] Pain in legs when laying flat   [] Claudication   [] Pain in feet when walking  [] Pain in feet at rest  [] Pain in feet when laying flat   [] History of DVT   [] Phlebitis   [x] Swelling in legs   [] Varicose veins   [] Non-healing ulcers Pulmonary:   [] Uses home oxygen   [] Productive cough   [] Hemoptysis   [] Wheeze  [] COPD   [x] Asthma Neurologic:  [] Dizziness  [] Blackouts   [] Seizures   [] History of stroke   [] History of TIA  [] Aphasia   [] Temporary blindness   [] Dysphagia   [] Weakness or numbness in arms   [] Weakness or numbness in legs Musculoskeletal:  [x] Arthritis   [] Joint swelling   [] Joint pain   [] Low back pain Hematologic:  [] Easy  bruising  [] Easy bleeding   [] Hypercoagulable state   [] Anemic  [] Hepatitis Gastrointestinal:  [] Blood in stool   [] Vomiting blood  [] Gastroesophageal reflux/heartburn   [] Abdominal pain Genitourinary:  [x] Chronic kidney disease   [] Difficult urination  [] Frequent urination  [] Burning with urination   [] Hematuria Skin:  [] Rashes   [] Ulcers   [] Wounds Psychological:  [] History of anxiety   []  History of major depression.    Physical Exam BP (!) 145/82 (BP Location: Left Arm)   Pulse 60   Resp 16   Wt 173 lb 3.2 oz (78.6 kg)   BMI 33.83 kg/m  Gen:  WD/WN, NAD Head: [place]/AT, No temporalis wasting.  Ear/Nose/Throat: Hearing grossly intact, nares w/o erythema or drainage, oropharynx w/o Erythema/Exudate Eyes: Conjunctiva clear, sclera non-icteric  Neck: trachea midline.  No JVD.  Pulmonary:  Good air movement, respirations not labored, no use of accessory muscles  Cardiac: RRR, no JVD Vascular:  Vessel Right Left  Radial Palpable Palpable                                   Gastrointestinal:. No masses, surgical incisions, or scars. Musculoskeletal: M/S 5/5 throughout.  Extremities without ischemic changes.  No deformity or atrophy.  The largest burden of her swelling is in the medial knee and lower thigh area from her lymphedema with pockets of fluid collection in these locations.  The right leg is a little bigger than the left.  3+ right lower extremity edema, 2+ left lower extremity edema. Neurologic: Sensation grossly intact in extremities.  Symmetrical.  Speech is fluent. Motor exam as listed above. Psychiatric: Judgment intact, Mood & affect appropriate for pt's clinical situation. Dermatologic: No rashes or ulcers noted.  No cellulitis or open wounds.    Radiology No results found.  Labs No results found for this or any previous visit (from the past 2160 hour(s)).  Assessment/Plan:  Lymphedema Patient has longstanding lymphedema but has not had a venous workup to my  knowledge.  With her weight loss after her bariatric surgery, her legs are still quite swollen particularly around the knee and lower thigh area where there are large pockets of swelling.  She has been wearing compression garments for years and will continue to do so.  She elevates her legs and will continue to do so.  She clearly would benefit from a lymphedema pump as an adjuvant therapy for her stage III lymphedema.  I think she would benefit from referral to the lymphedema clinic for evaluation for manual massage.  I have also recommended a venous reflux study to ensure that significant venous disease is not pregnant as well.  We will see her back following her duplex.  CKD (chronic kidney disease), stage III (HCC) Renal disease worsens lower extremity swelling.  Controlled type 2 diabetes with neuropathy (HCC) blood glucose control important in reducing the progression of atherosclerotic disease. Also, involved in wound healing. On appropriate medications.   Benign hypertension with CKD (chronic kidney disease) stage III (HCC) blood pressure control important in reducing the progression of atherosclerotic disease. On appropriate oral medications.      Festus Barren 02/13/2022, 8:27 AM   This note was created with Dragon medical transcription system.  Any errors from dictation are unintentional.

## 2022-02-13 DIAGNOSIS — I890 Lymphedema, not elsewhere classified: Secondary | ICD-10-CM | POA: Insufficient documentation

## 2022-02-13 NOTE — Assessment & Plan Note (Signed)
Patient has longstanding lymphedema but has not had a venous workup to my knowledge.  With her weight loss after her bariatric surgery, her legs are still quite swollen particularly around the knee and lower thigh area where there are large pockets of swelling.  She has been wearing compression garments for years and will continue to do so.  She elevates her legs and will continue to do so.  She clearly would benefit from a lymphedema pump as an adjuvant therapy for her stage III lymphedema.  I think she would benefit from referral to the lymphedema clinic for evaluation for manual massage.  I have also recommended a venous reflux study to ensure that significant venous disease is not pregnant as well.  We will see her back following her duplex.

## 2022-02-13 NOTE — Assessment & Plan Note (Signed)
blood glucose control important in reducing the progression of atherosclerotic disease. Also, involved in wound healing. On appropriate medications.  

## 2022-02-13 NOTE — Assessment & Plan Note (Signed)
blood pressure control important in reducing the progression of atherosclerotic disease. On appropriate oral medications.  

## 2022-02-13 NOTE — Assessment & Plan Note (Signed)
Renal disease worsens lower extremity swelling.

## 2022-03-01 ENCOUNTER — Encounter (INDEPENDENT_AMBULATORY_CARE_PROVIDER_SITE_OTHER): Payer: BC Managed Care – PPO

## 2022-03-01 ENCOUNTER — Ambulatory Visit (INDEPENDENT_AMBULATORY_CARE_PROVIDER_SITE_OTHER): Payer: BC Managed Care – PPO | Admitting: Nurse Practitioner

## 2022-03-18 ENCOUNTER — Ambulatory Visit (INDEPENDENT_AMBULATORY_CARE_PROVIDER_SITE_OTHER): Payer: BC Managed Care – PPO | Admitting: Nurse Practitioner

## 2022-03-18 ENCOUNTER — Encounter (INDEPENDENT_AMBULATORY_CARE_PROVIDER_SITE_OTHER): Payer: BC Managed Care – PPO

## 2022-04-03 ENCOUNTER — Encounter (INDEPENDENT_AMBULATORY_CARE_PROVIDER_SITE_OTHER): Payer: BC Managed Care – PPO

## 2022-04-03 ENCOUNTER — Ambulatory Visit (INDEPENDENT_AMBULATORY_CARE_PROVIDER_SITE_OTHER): Payer: BC Managed Care – PPO | Admitting: Nurse Practitioner

## 2022-06-03 ENCOUNTER — Ambulatory Visit: Payer: Federal, State, Local not specified - PPO | Attending: Family Medicine | Admitting: Occupational Therapy

## 2022-06-06 ENCOUNTER — Ambulatory Visit: Payer: Federal, State, Local not specified - PPO | Admitting: Occupational Therapy

## 2022-06-11 ENCOUNTER — Ambulatory Visit: Payer: Federal, State, Local not specified - PPO | Admitting: Occupational Therapy

## 2022-06-13 ENCOUNTER — Encounter: Payer: Self-pay | Admitting: Occupational Therapy

## 2022-06-18 ENCOUNTER — Other Ambulatory Visit (INDEPENDENT_AMBULATORY_CARE_PROVIDER_SITE_OTHER): Payer: Self-pay | Admitting: Vascular Surgery

## 2022-06-18 DIAGNOSIS — M7989 Other specified soft tissue disorders: Secondary | ICD-10-CM

## 2022-06-19 ENCOUNTER — Encounter (INDEPENDENT_AMBULATORY_CARE_PROVIDER_SITE_OTHER): Payer: Self-pay | Admitting: Nurse Practitioner

## 2022-06-19 ENCOUNTER — Ambulatory Visit (INDEPENDENT_AMBULATORY_CARE_PROVIDER_SITE_OTHER): Payer: BC Managed Care – PPO

## 2022-06-19 ENCOUNTER — Encounter: Payer: Self-pay | Admitting: Occupational Therapy

## 2022-06-19 ENCOUNTER — Ambulatory Visit (INDEPENDENT_AMBULATORY_CARE_PROVIDER_SITE_OTHER): Payer: BC Managed Care – PPO | Admitting: Nurse Practitioner

## 2022-06-19 VITALS — BP 143/81 | HR 51 | Resp 18 | Ht 60.0 in | Wt 189.4 lb

## 2022-06-19 DIAGNOSIS — N1831 Chronic kidney disease, stage 3a: Secondary | ICD-10-CM

## 2022-06-19 DIAGNOSIS — E1140 Type 2 diabetes mellitus with diabetic neuropathy, unspecified: Secondary | ICD-10-CM | POA: Diagnosis not present

## 2022-06-19 DIAGNOSIS — I890 Lymphedema, not elsewhere classified: Secondary | ICD-10-CM | POA: Diagnosis not present

## 2022-06-19 DIAGNOSIS — M7989 Other specified soft tissue disorders: Secondary | ICD-10-CM | POA: Diagnosis not present

## 2022-06-21 ENCOUNTER — Encounter: Payer: Self-pay | Admitting: Occupational Therapy

## 2022-06-26 ENCOUNTER — Encounter: Payer: Self-pay | Admitting: Occupational Therapy

## 2022-06-28 ENCOUNTER — Encounter: Payer: Self-pay | Admitting: Occupational Therapy

## 2022-07-03 ENCOUNTER — Encounter: Payer: Self-pay | Admitting: Occupational Therapy

## 2022-07-05 ENCOUNTER — Encounter: Payer: Self-pay | Admitting: Occupational Therapy

## 2022-07-08 ENCOUNTER — Encounter (INDEPENDENT_AMBULATORY_CARE_PROVIDER_SITE_OTHER): Payer: Self-pay | Admitting: Nurse Practitioner

## 2022-07-08 NOTE — Progress Notes (Signed)
Patient ID: Ashley Terry, female   DOB: 20-Aug-1965, 57 y.o.   MRN: [id number]  Chief Complaint  Patient presents with   Follow-up    pt conv bilateral reflux.    HPI Ashley Terry is a 57 y.o. female.  The patient returns today for noninvasive studies.  The patient has had longstanding lymphedema for many years.  She recently underwent bariatric surgery and has had more noticeable swelling in her legs.  This is most prominent in the upper calf, knee, and lower thigh area medially bilaterally.  She denies any current open wounds but has had recurring wounds due to her clothes rubbing on the very large pockets over the past several months.  She has been wearing compression socks for many years.  She tries to elevate her legs and stay active.  She has had DVT studies but no functional venous assessment to her knowledge.    Today the patient has no evidence of DVT or superficial thrombophlebitis bilaterally.  There is evidence of deep venous insufficiency in the right lower extremity.  None in the left.  No evidence of superficial venous reflux noted bilaterally.   Past Medical History:  Diagnosis Date   Asthma    Dyspnea    with exertion   Frequent headaches    GERD (gastroesophageal reflux disease)    takes protonix   Seasonal allergies    Sleep apnea    CPAP    Past Surgical History:  Procedure Laterality Date   CESAREAN SECTION  1980   CESAREAN SECTION  1992   CHOLECYSTECTOMY N/A 04/16/2016   Procedure: LAPAROSCOPIC CHOLECYSTECTOMY;  Surgeon: Lattie Haw, MD;  Location: [facility] ORS;  Service: General;  Laterality: N/A;   TONSILECTOMY, ADENOIDECTOMY, BILATERAL MYRINGOTOMY AND TUBES  1980   TUBAL LIGATION  1994     Family History  Problem Relation Age of Onset   Diabetes Mother    Hypertension Mother    Stroke Father    Cancer Father        Amyloid   Pancreatic cancer Maternal Grandmother    Breast cancer Paternal Grandmother    Prostate cancer Maternal Grandfather     Cancer Paternal Grandfather        unknown      Social History   Tobacco Use   Smoking status: Never   Smokeless tobacco: Never  Substance Use Topics   Alcohol use: No   Drug use: No     No Known Allergies  Current Outpatient Medications  Medication Sig Dispense Refill   acetaminophen (TYLENOL) 500 MG tablet Take 500 mg by mouth every 6 (six) hours as needed for moderate pain.     amLODipine (NORVASC) 10 MG tablet  (Patient not taking: Reported on 06/19/2022)     benzonatate (TESSALON) 100 MG capsule Take 1 capsule (100 mg total) by mouth 3 (three) times daily as needed. (Patient not taking: Reported on 06/19/2022) 30 capsule 0   Calcium Carbonate-Vit D-Min (CALCIUM 1200 PO) Take by mouth. (Patient not taking: Reported on 06/19/2022)     clobetasol ointment (TEMOVATE) 0.05 % Apply 1 application topically 2 (two) times daily. For up to 2 weeks, or until healed. May repeat course. (Patient not taking: Reported on 06/19/2022) 30 g 1   diclofenac Sodium (VOLTAREN) 1 % GEL Apply 2 g topically 3 (three) times daily as needed. (Patient not taking: Reported on 06/19/2022) 100 g 2   Ferrous Gluconate-C-Folic Acid (IRON-C PO) Take by mouth. (Patient not taking: Reported on  06/19/2022)     Multiple Vitamin (MULTIVITAMIN ADULT PO) Take 1 capsule by mouth daily. (Patient not taking: Reported on 06/19/2022)     Wheat Dextrin (BENEFIBER DRINK MIX PO) Take by mouth. (Patient not taking: Reported on 06/19/2022)     No current facility-administered medications for this visit.      REVIEW OF SYSTEMS (Negative unless checked)  Constitutional: [x] Weight loss  [] Fever  [] Chills Cardiac: [] Chest pain   [] Chest pressure   [] Palpitations   [] Shortness of breath when laying flat   [] Shortness of breath at rest   [] Shortness of breath with exertion. Vascular:  [] Pain in legs with walking   [] Pain in legs at rest   [] Pain in legs when laying flat   [] Claudication   [] Pain in feet when walking  [] Pain in feet at  rest  [] Pain in feet when laying flat   [] History of DVT   [] Phlebitis   [x] Swelling in legs   [] Varicose veins   [] Non-healing ulcers Pulmonary:   [] Uses home oxygen   [] Productive cough   [] Hemoptysis   [] Wheeze  [] COPD   [x] Asthma Neurologic:  [] Dizziness  [] Blackouts   [] Seizures   [] History of stroke   [] History of TIA  [] Aphasia   [] Temporary blindness   [] Dysphagia   [] Weakness or numbness in arms   [] Weakness or numbness in legs Musculoskeletal:  [x] Arthritis   [] Joint swelling   [] Joint pain   [] Low back pain Hematologic:  [] Easy bruising  [] Easy bleeding   [] Hypercoagulable state   [] Anemic  [] Hepatitis Gastrointestinal:  [] Blood in stool   [] Vomiting blood  [] Gastroesophageal reflux/heartburn   [] Abdominal pain Genitourinary:  [x] Chronic kidney disease   [] Difficult urination  [] Frequent urination  [] Burning with urination   [] Hematuria Skin:  [] Rashes   [] Ulcers   [] Wounds Psychological:  [] History of anxiety   []  History of major depression.    Physical Exam BP (!) 143/81 (BP Location: Right Arm)   Pulse (!) 51   Resp 18   Ht 5' (1.524 m)   Wt 189 lb 6.4 oz (85.9 kg)   BMI 36.99 kg/m  Gen:  WD/WN, NAD Head: [place]/AT, No temporalis wasting.  Ear/Nose/Throat: Hearing grossly intact, nares w/o erythema or drainage, oropharynx w/o Erythema/Exudate Eyes: Conjunctiva clear, sclera non-icteric  Neck: trachea midline.  No JVD.  Pulmonary:  Good air movement, respirations not labored, no use of accessory muscles  Cardiac: RRR, no JVD Vascular:  Vessel Right Left  Radial Palpable Palpable                                   Gastrointestinal:. No masses, surgical incisions, or scars. Musculoskeletal: M/S 5/5 throughout.  Extremities without ischemic changes.  No deformity or atrophy.  The largest burden of her swelling is in the medial knee and lower thigh area from her lymphedema with pockets of fluid collection in these locations.  The right leg is a little bigger than the  left.  3+ right lower extremity edema, 2+ left lower extremity edema. Neurologic: Sensation grossly intact in extremities.  Symmetrical.  Speech is fluent. Motor exam as listed above. Psychiatric: Judgment intact, Mood & affect appropriate for pt's clinical situation. Dermatologic: No rashes or ulcers noted.  No cellulitis or open wounds.    Radiology VAS [place] LOWER EXTREMITY VENOUS REFLUX  Result Date: 06/21/2022  Lower Venous Reflux Study Patient Name:  JULIE RANFT  Date of Exam:   06/19/2022  Medical Rec #: [id number]    Accession #:    [id number] Date of Birth: 06/30/1965    Patient Gender: F Patient Age:   53 years Exam Location:  [facility] Procedure:      VAS [place] LOWER EXTREMITY VENOUS REFLUX Referring Phys: Barbara Cower DEW --------------------------------------------------------------------------------  Indications: Swelling, and Edema.  Risk Factors: Bilateral lower extremity lymphedema. Performing Technologist: Hardie Lora RVT  Examination Guidelines: A complete evaluation includes B-mode imaging, spectral Doppler, color Doppler, and power Doppler as needed of all accessible portions of each vessel. Bilateral testing is considered an integral part of a complete examination. Limited examinations for reoccurring indications may be performed as noted. The reflux portion of the exam is performed with the patient in reverse Trendelenburg. Significant venous reflux is defined as >500 ms in the superficial venous system, and >1 second in the deep venous system.  Venous Reflux Times +--------------+---------+------+-----------+------------+--------+ RIGHT         Reflux NoRefluxReflux TimeDiameter cmsComments                         Yes                                  +--------------+---------+------+-----------+------------+--------+ CFV                     yes   >1 second                      +--------------+---------+------+-----------+------------+--------+ FV prox        no                                             +--------------+---------+------+-----------+------------+--------+ FV mid        no                                             +--------------+---------+------+-----------+------------+--------+ FV dist       no                                             +--------------+---------+------+-----------+------------+--------+ Popliteal     no                                             +--------------+---------+------+-----------+------------+--------+ GSV at [facility]    no                            0.70             +--------------+---------+------+-----------+------------+--------+ GSV prox thighno                            0.49             +--------------+---------+------+-----------+------------+--------+ GSV mid thigh no  0.65             +--------------+---------+------+-----------+------------+--------+ GSV dist thighno                            0.60             +--------------+---------+------+-----------+------------+--------+ GSV at knee   no                            0.59             +--------------+---------+------+-----------+------------+--------+ GSV prox calf no                            0.60             +--------------+---------+------+-----------+------------+--------+ SSV Pop Fossa no                            0.21             +--------------+---------+------+-----------+------------+--------+ SSV prox calf no                            0.23             +--------------+---------+------+-----------+------------+--------+ SSV mid calf  no                            0.25             +--------------+---------+------+-----------+------------+--------+  +--------------+---------+------+-----------+------------+--------+ LEFT          Reflux NoRefluxReflux TimeDiameter cmsComments                         Yes                                   +--------------+---------+------+-----------+------------+--------+ CFV           no                                             +--------------+---------+------+-----------+------------+--------+ FV prox       no                                             +--------------+---------+------+-----------+------------+--------+ FV mid        no                                             +--------------+---------+------+-----------+------------+--------+ FV dist       no                                             +--------------+---------+------+-----------+------------+--------+ Popliteal     no                                             +--------------+---------+------+-----------+------------+--------+  GSV at [facility]    no                            0.61             +--------------+---------+------+-----------+------------+--------+ GSV prox thighno                            0.52             +--------------+---------+------+-----------+------------+--------+ GSV mid thigh no                            0.45             +--------------+---------+------+-----------+------------+--------+ GSV dist thighno                            0.49             +--------------+---------+------+-----------+------------+--------+ GSV at knee   no                            0.44             +--------------+---------+------+-----------+------------+--------+ GSV prox calf no                            0.31             +--------------+---------+------+-----------+------------+--------+ SSV Pop Fossa no                            0.42             +--------------+---------+------+-----------+------------+--------+ SSV prox calf no                            0.36             +--------------+---------+------+-----------+------------+--------+ SSV mid calf  no                            0.29              +--------------+---------+------+-----------+------------+--------+   Summary: Right: - No evidence of deep vein thrombosis seen in the right lower extremity, from the common femoral through the popliteal veins. - No evidence of superficial venous thrombosis in the right lower extremity. - No evidence of superficial venous reflux seen in the right greater saphenous vein. - No evidence of superficial venous reflux seen in the right short saphenous vein. - Venous reflux is noted in the right common femoral vein.  Left: - No evidence of deep vein thrombosis seen in the left lower extremity, from the common femoral through the popliteal veins. - No evidence of superficial venous thrombosis in the left lower extremity. - There is no evidence of venous reflux seen in the left lower extremity. - No evidence of superficial venous reflux seen in the left greater saphenous vein. - No evidence of superficial venous reflux seen in the left short saphenous vein.  *See table(s) above for measurements and observations. Electronically signed by Festus Barren MD on 06/21/2022 at 11:00:18 AM.    Final     Labs No results found for this or any previous visit (  from the past 2160 hour(s)).  Assessment/Plan:  1. Lymphedema Recommend:  No surgery or intervention at this point in time.   The Patient is CEAP [id number].  The patient has been wearing compression for more than 12 weeks with no or little benefit.  The patient has been exercising daily for more than 12 weeks. The patient has been elevating and taking OTC pain medications for more than 12 weeks.  None of these have have eliminated the pain related to the lymphedema or the discomfort regarding excessive swelling and venous congestion.    I have reviewed my discussion with the patient regarding lymphedema and why it  causes symptoms.  Patient will continue wearing graduated compression on a daily basis. The patient should put the compression on first thing in the morning  and removing them in the evening. The patient should not sleep in the compression.   In addition, behavioral modification throughout the day will be continued.  This will include frequent elevation (such as in a recliner), use of over the counter pain medications as needed and exercise such as walking.  The systemic causes for chronic edema such as liver, kidney and cardiac etiologies do not appear to have significant changed over the past year.    The patient has chronic , severe lymphedema with hyperpigmentation of the skin and has done MLD, skin care, medication, diet, exercise, elevation and compression for 4 weeks with no improvement,  I am recommending a lymphedema pump.  The patient still has stage 3 lymphedema and therefore, I believe that a lymph pump is needed to improve the control of the patient's lymphedema and improve the quality of life.  Additionally, a lymph pump is warranted because it will reduce the risk of cellulitis and ulceration in the future.  Patient should follow-up in six months   2. Controlled type 2 diabetes with neuropathy (HCC) Continue hypoglycemic medications as already ordered, these medications have been reviewed and there are no changes at this time.  Hgb A1C to be monitored as already arranged by primary service  3. Stage 3a chronic kidney disease (HCC) We also be contributing to the patient's lower extremity edema        Georgiana Spinner 07/08/2022, 12:26 AM   This note was created with Dragon medical transcription system.  Any errors from dictation are unintentional.

## 2022-07-10 ENCOUNTER — Encounter: Payer: Self-pay | Admitting: Occupational Therapy

## 2022-07-12 ENCOUNTER — Encounter: Payer: Self-pay | Admitting: Occupational Therapy

## 2022-11-18 ENCOUNTER — Telehealth: Payer: BC Managed Care – PPO | Admitting: Family Medicine

## 2022-11-18 ENCOUNTER — Telehealth: Payer: BC Managed Care – PPO | Admitting: Nurse Practitioner

## 2022-11-18 ENCOUNTER — Ambulatory Visit: Payer: Self-pay

## 2022-11-18 DIAGNOSIS — J40 Bronchitis, not specified as acute or chronic: Secondary | ICD-10-CM

## 2022-11-18 DIAGNOSIS — R6889 Other general symptoms and signs: Secondary | ICD-10-CM

## 2022-11-18 DIAGNOSIS — R079 Chest pain, unspecified: Secondary | ICD-10-CM

## 2022-11-18 MED ORDER — DOXYCYCLINE HYCLATE 100 MG PO TABS: 100.0000 mg | ORAL_TABLET | Freq: Two times a day (BID) | ORAL | 0 refills | Status: AC

## 2022-11-18 MED ORDER — PREDNISONE 20 MG PO TABS: 20.0000 mg | ORAL_TABLET | Freq: Two times a day (BID) | ORAL | 0 refills | Status: AC

## 2022-11-18 NOTE — Progress Notes (Signed)
Advised to convert to a VV for better review of symptoms.

## 2022-11-18 NOTE — Telephone Encounter (Signed)
Chief Complaint: Cough Symptoms: Cough, fever, chest tightness, chills, body ache, headache Frequency: Constant  Pertinent Negatives: Patient denies nausea, vomiting, runny nose Disposition: [] ED /[] Urgent Care (no appt availability in office) / [] Appointment(In office/virtual)/ [x]  [facility]/ [] Home Care/ [] Refused Recommended Disposition /[] [facility]/ []  Follow-up with PCP Additional Notes: Patient states she has had a dry cough for 1 week now and it is not getting better with over the counter medication. Patient stated she has mild SOB, chest tightness, headache, fever, chills and body aches. Patient stated she has not been tested for Covid or Flu and does not have any known exposure to either. Care advice was given. No appointments available in office. Patient has been scheduled for a virtual urgent care appointment today at 1145. Advised if symptoms get worse to callback. Patient verbalized understanding. Reason for Disposition  [1] Continuous (nonstop) coughing interferes with work or school AND [2] no improvement using cough treatment per Care Advice  Answer Assessment - Initial Assessment Questions 1. ONSET: "When did the cough begin?"      Monday evening 2. SEVERITY: "How bad is the cough today?"      Moderate 3. SPUTUM: "Describe the color of your sputum" (none, dry cough; clear, white, yellow, green)     None 4. HEMOPTYSIS: "Are you coughing up any blood?" If so ask: "How much?" (flecks, streaks, tablespoons, etc.)     No 5. DIFFICULTY BREATHING: "Are you having difficulty breathing?" If Yes, ask: "How bad is it?" (e.g., mild, moderate, severe)    - MILD: No SOB at rest, mild SOB with walking, speaks normally in sentences, can lie down, no retractions, pulse < 100.    - MODERATE: SOB at rest, SOB with minimal exertion and prefers to sit, cannot lie down flat, speaks in phrases, mild retractions, audible wheezing, pulse 100-120.    - SEVERE: Very SOB at  rest, speaks in single words, struggling to breathe, sitting hunched forward, retractions, pulse > 120      Mild SOB 6. FEVER: "Do you have a fever?" If Yes, ask: "What is your temperature, how was it measured, and when did it start?"     100.0 this morning 7. CARDIAC HISTORY: "Do you have any history of heart disease?" (e.g., heart attack, congestive heart failure)      No 8. LUNG HISTORY: "Do you have any history of lung disease?"  (e.g., pulmonary embolus, asthma, emphysema)     No 9. PE RISK FACTORS: "Do you have a history of blood clots?" (or: recent major surgery, recent prolonged travel, bedridden)     No 10. OTHER SYMPTOMS: "Do you have any other symptoms?" (e.g., runny nose, wheezing, chest pain)       Cough, chest tightness, SOB, Fever, chills, body ache, headache 12. TRAVEL: "Have you traveled out of the country in the last month?" (e.g., travel history, exposures)       No  Protocols used: Cough - Acute Non-Productive-A-AH

## 2022-11-18 NOTE — Progress Notes (Signed)
Virtual Visit Consent   Ashley Terry, you are scheduled for a virtual visit with a [name] provider today. Just as with appointments in the office, your consent must be obtained to participate. Your consent will be active for this visit and any virtual visit you may have with one of our providers in the next 365 days. If you have a MyChart account, a copy of this consent can be sent to you electronically.  As this is a virtual visit, video technology does not allow for your provider to perform a traditional examination. This may limit your provider's ability to fully assess your condition. If your provider identifies any concerns that need to be evaluated in person or the need to arrange testing (such as labs, EKG, etc.), we will make arrangements to do so. Although advances in technology are sophisticated, we cannot ensure that it will always work on either your end or our end. If the connection with a video visit is poor, the visit may have to be switched to a telephone visit. With either a video or telephone visit, we are not always able to ensure that we have a secure connection.  By engaging in this virtual visit, you consent to the provision of healthcare and authorize for your insurance to be billed (if applicable) for the services provided during this visit. Depending on your insurance coverage, you may receive a charge related to this service.  I need to obtain your verbal consent now. Are you willing to proceed with your visit today? Ashley Terry has provided verbal consent on 11/18/2022 for a virtual visit (video or telephone). Ashley Simas, FNP  Date: 11/18/2022 11:46 AM  Virtual Visit via Video Note   I, Ashley Terry, connected with  Ashley Terry  ([id number], [birth year]) on 11/18/22 at 11:45 AM EDT by a video-enabled telemedicine application and verified that I am speaking with the correct person using two identifiers.  Location: Patient: Virtual Visit Location Patient: Home Provider:  Virtual Visit Location Provider: Home Office   I discussed the limitations of evaluation and management by telemedicine and the availability of in person appointments. The patient expressed understanding and agreed to proceed.    History of Present Illness: Ashley Terry is a 57 y.o. who identifies as a female who was assigned female at birth, and is being seen today for loss of voice, cough (dry), body aches, possible fever that broke overnight.  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   Right carpal tunnel syndrome 04/24/2018   Anemia 11/25/2016   Osteoarthritis of left knee 11/25/2016   Biliary colic 04/16/2016   Hyperlipidemia associated with type 2 diabetes mellitus (HCC) 02/01/2016   Chest tightness or pressure 01/22/2016   CKD (chronic kidney disease), stage III (HCC) 01/06/2016   Benign hypertension with CKD (chronic kidney disease) stage III (HCC) 01/05/2016   Controlled type 2 diabetes with neuropathy (HCC) 01/05/2016   OSA (obstructive sleep apnea) 01/05/2016   Morbid  obesity (HCC) 01/05/2016   Gastroesophageal reflux disease without esophagitis 01/05/2016   Chronic headache 01/05/2016   Cholelithiasis 01/05/2016    Allergies: No Known Allergies Medications:  Current Outpatient Medications:    acetaminophen (TYLENOL) 500 MG tablet, Take 500 mg by mouth every 6 (six) hours as needed for moderate pain., Disp: , Rfl:    amLODipine (NORVASC) 10 MG tablet, , Disp: , Rfl:    benzonatate (TESSALON) 100 MG  capsule, Take 1 capsule (100 mg total) by mouth 3 (three) times daily as needed. (Patient not taking: Reported on 06/19/2022), Disp: 30 capsule, Rfl: 0   Calcium Carbonate-Vit D-Min (CALCIUM 1200 PO), Take by mouth. (Patient not taking: Reported on 06/19/2022), Disp: , Rfl:    clobetasol ointment (TEMOVATE) 0.05 %, Apply 1 application topically 2 (two) times daily. For up to 2 weeks, or until healed. May repeat course. (Patient not taking: Reported on 06/19/2022), Disp: 30 g, Rfl: 1   diclofenac Sodium (VOLTAREN) 1 % GEL, Apply 2 g topically 3 (three) times daily as needed. (Patient not taking: Reported on 06/19/2022), Disp: 100 g, Rfl: 2   Ferrous Gluconate-C-Folic Acid (IRON-C PO), Take by mouth. (Patient not taking: Reported on 06/19/2022), Disp: , Rfl:    Multiple Vitamin (MULTIVITAMIN ADULT PO), Take 1 capsule by mouth daily. (Patient not taking: Reported on 06/19/2022), Disp: , Rfl:    Wheat Dextrin (BENEFIBER DRINK MIX PO), Take by mouth. (Patient not taking: Reported on 06/19/2022), Disp: , Rfl:   Observations/Objective: Patient is well-developed, well-nourished in no acute distress.  Resting comfortably  at home.  Head is normocephalic, atraumatic.  No labored breathing.  Speech is clear and coherent with logical content.  Patient is alert and oriented at baseline.    Assessment and Plan:  1. Bronchitis Continue Mucinex without decongestant over the counter as directed for added relief  Assure hydration/push fluids   - doxycycline (VIBRA-TABS) 100 MG tablet; Take 1 tablet (100 mg total) by mouth 2 (two) times daily for 7 days.  Dispense: 14 tablet; Refill: 0 - predniSONE (DELTASONE) 20 MG tablet; Take 1 tablet (20 mg total) by mouth 2 (two) times daily with a meal for 5 days.  Dispense: 10 tablet; Refill: 0     Follow Up Instructions: I discussed the assessment and treatment plan with the patient. The patient was provided an opportunity to ask questions and all were answered. The  patient agreed with the plan and demonstrated an understanding of the instructions.  A copy of instructions were sent to the patient via MyChart unless otherwise noted below.    The patient was advised to call back or seek an in-person evaluation if the symptoms worsen or if the condition fails to improve as anticipated.  Time:  I spent 15 minutes with the patient via telehealth technology discussing the above problems/concerns.    Ashley Simas, FNP

## 2022-12-20 ENCOUNTER — Ambulatory Visit (INDEPENDENT_AMBULATORY_CARE_PROVIDER_SITE_OTHER): Payer: BC Managed Care – PPO | Admitting: Vascular Surgery

## 2023-01-17 ENCOUNTER — Encounter (INDEPENDENT_AMBULATORY_CARE_PROVIDER_SITE_OTHER): Payer: Self-pay

## 2023-01-17 ENCOUNTER — Ambulatory Visit (INDEPENDENT_AMBULATORY_CARE_PROVIDER_SITE_OTHER): Payer: BC Managed Care – PPO | Admitting: Vascular Surgery

## 2023-07-15 ENCOUNTER — Encounter (INDEPENDENT_AMBULATORY_CARE_PROVIDER_SITE_OTHER): Payer: Self-pay
